# Patient Record
Sex: Male | Born: 1950
Health system: Southern US, Community
[De-identification: ages and names within clinical notes are randomized; demographics above are authoritative.]

## PROBLEM LIST (undated history)

## (undated) HISTORY — PX: KNEE SURGERY: SHX244

---

## 2011-01-10 ENCOUNTER — Other Ambulatory Visit (HOSPITAL_COMMUNITY): Payer: Self-pay | Admitting: Orthopedic Surgery

## 2011-01-10 ENCOUNTER — Ambulatory Visit (HOSPITAL_COMMUNITY)
Admission: RE | Admit: 2011-01-10 | Discharge: 2011-01-10 | Disposition: A | Discharge status: Home / Self Care | Payer: 59 | Source: Ambulatory Visit | Attending: Orthopedic Surgery | Admitting: Orthopedic Surgery

## 2011-01-10 ENCOUNTER — Encounter (HOSPITAL_COMMUNITY)
Admission: RE | Admit: 2011-01-10 | Discharge: 2011-01-10 | Disposition: A | Discharge status: Home / Self Care | Payer: 59 | Source: Ambulatory Visit | Attending: Orthopedic Surgery | Admitting: Orthopedic Surgery

## 2011-01-10 DIAGNOSIS — M1711 Unilateral primary osteoarthritis, right knee: Secondary | ICD-10-CM

## 2011-01-10 DIAGNOSIS — Z01812 Encounter for preprocedural laboratory examination: Secondary | ICD-10-CM | POA: Insufficient documentation

## 2011-01-10 DIAGNOSIS — Z01818 Encounter for other preprocedural examination: Secondary | ICD-10-CM | POA: Insufficient documentation

## 2011-01-10 DIAGNOSIS — IMO0002 Reserved for concepts with insufficient information to code with codable children: Secondary | ICD-10-CM | POA: Insufficient documentation

## 2011-01-10 DIAGNOSIS — M1710 Unilateral primary osteoarthritis, unspecified knee: Secondary | ICD-10-CM | POA: Insufficient documentation

## 2011-01-10 LAB — COMPREHENSIVE METABOLIC PANEL
ALT: 17 U/L (ref 0–53)
AST: 17 U/L (ref 0–37)
CO2: 30 mEq/L (ref 19–32)
Calcium: 9.2 mg/dL (ref 8.4–10.5)
Chloride: 104 mEq/L (ref 96–112)
GFR calc non Af Amer: >60 mL/min (ref >60)
Sodium: 141 mEq/L (ref 135–145)

## 2011-01-10 LAB — SURGICAL PCR SCREEN: MRSA, PCR: NEGATIVE

## 2011-01-10 LAB — URINALYSIS, ROUTINE W REFLEX MICROSCOPIC
Nitrite: NEGATIVE
Specific Gravity, Urine: 1.01 (ref 1.005–1.030)
Urobilinogen, UA: 0.2 mg/dL (ref 0.0–1.0)

## 2011-01-10 LAB — CBC
MCH: 30.7 pg (ref 26.0–34.0)
Platelets: 209 10*3/uL (ref 150–400)
RBC: 5.25 MIL/uL (ref 4.22–5.81)
WBC: 7.7 10*3/uL (ref 4.0–10.5)

## 2011-01-10 LAB — APTT: aPTT: 30 seconds (ref 24–37)

## 2011-01-11 LAB — URINE CULTURE

## 2011-01-16 ENCOUNTER — Inpatient Hospital Stay (HOSPITAL_COMMUNITY)
Admission: RE | Admit: 2011-01-16 | Discharge: 2011-01-18 | DRG: 470 | Disposition: A | Discharge status: Home Health | Payer: 59 | Source: Ambulatory Visit | Attending: Orthopedic Surgery | Admitting: Orthopedic Surgery

## 2011-01-16 ENCOUNTER — Inpatient Hospital Stay (HOSPITAL_COMMUNITY): Payer: 59

## 2011-01-16 DIAGNOSIS — Z91040 Latex allergy status: Secondary | ICD-10-CM

## 2011-01-16 DIAGNOSIS — N400 Enlarged prostate without lower urinary tract symptoms: Secondary | ICD-10-CM | POA: Diagnosis present

## 2011-01-16 DIAGNOSIS — G8929 Other chronic pain: Secondary | ICD-10-CM | POA: Diagnosis present

## 2011-01-16 DIAGNOSIS — M24569 Contracture, unspecified knee: Secondary | ICD-10-CM | POA: Diagnosis present

## 2011-01-16 DIAGNOSIS — M1710 Unilateral primary osteoarthritis, unspecified knee: Principal | ICD-10-CM | POA: Diagnosis present

## 2011-01-16 LAB — ABO/RH: ABO/RH(D): O POS

## 2011-01-16 LAB — TYPE AND SCREEN: ABO/RH(D): O POS

## 2011-01-17 LAB — BASIC METABOLIC PANEL
Calcium: 7.9 mg/dL — ABNORMAL LOW (ref 8.4–10.5)
Creatinine, Ser: 1.01 mg/dL (ref 0.50–1.35)
GFR calc Af Amer: >60 mL/min (ref >60)

## 2011-01-17 LAB — PROTIME-INR
INR: 1.08 (ref 0.00–1.49)
Prothrombin Time: 14.2 seconds (ref 11.6–15.2)

## 2011-01-17 LAB — CBC
MCHC: 34.5 g/dL (ref 30.0–36.0)
Platelets: 168 10*3/uL (ref 150–400)
RDW: 12.5 % (ref 11.5–15.5)

## 2011-01-18 LAB — PROTIME-INR: INR: 1.33 (ref 0.00–1.49)

## 2011-01-18 LAB — BASIC METABOLIC PANEL
Calcium: 8.4 mg/dL (ref 8.4–10.5)
GFR calc non Af Amer: >60 mL/min (ref >60)
Glucose, Bld: 118 mg/dL — ABNORMAL HIGH (ref 70–99)
Sodium: 137 mEq/L (ref 135–145)

## 2011-01-18 LAB — CBC
MCV: 85.6 fL (ref 78.0–100.0)
Platelets: 157 10*3/uL (ref 150–400)
RDW: 12.4 % (ref 11.5–15.5)
WBC: 8.8 10*3/uL (ref 4.0–10.5)

## 2011-01-24 NOTE — Op Note (Signed)
Eddie [name], CONLEY NO.:  [id number]  MEDICAL RECORD NO.:  [id number]  LOCATION:  5017                         FACILITY:  [facility]  PHYSICIAN:  Loreta Ave, M.D. DATE OF BIRTH:  07/07/1951  DATE OF PROCEDURE:  01/16/2011 DATE OF DISCHARGE:                              OPERATIVE REPORT   PREOPERATIVE DIAGNOSES:  Right knee end-stage degenerative arthritis, varus alignment, and mild flexion contracture.  POSTOPERATIVE DIAGNOSES:  Right knee end-stage degenerative arthritis, varus alignment, and mild flexion contracture.  PROCEDURES:  Right knee modified minimally invasive total knee replacement.  Soft tissue balancing.  Stryker triathlon prosthesis. Cemented pegged posterior stabilized #7 femoral component.  Cemented #7 tibial component, 9-mm polyethylene insert.  Resurfacing pegged medial offset cemented 38-mm patellar component.  SURGEON:  Loreta Ave, MD  ASSISTANT:  Genene Churn. Barry Dienes, [place], present throughout the entire case and necessary for timely completion of procedure.  ANESTHESIA:  General.  BLOOD LOSS:  Minimal.  SPECIMEN:  None.  CULTURE:  None.  COMPLICATIONS:  None.  DRESSING:  Soft compressive knee immobilizer.  DRAIN:  Hemovac x1.  TOURNIQUET TIME:  One hour.  PROCEDURE:  The patient was brought to the operating room and placed on the operating table in supine position.  After adequate anesthesia had been obtained, right knee was examined.  Varus alignment partially correctable, very mild flexion contracture.  Good flexion.  Tourniquet was applied, prepped and draped in the usual sterile fashion. Exsanguinated with elevation of Esmarch, tourniquet was inflated to 350 mmHg.  Straight incision above the patella down to tibial tubercle. Hemostasis with cautery.  Medial arthrotomy, vastus splitting preserving quad tendon.  Knee was exposed, grade 4 change throughout.  Remnants of menisci, cruciate ligaments, loose bodies, and  spurs were removed. Distal femur was exposed.  Intramedullary guide was placed.  Distal cut 10 mm set at 5 degrees of valgus.  Using epicondylar axis, sized, cut, and fitted for posterior stabilized #7 femoral component.  Proximal tibia was exposed.  Extramedullary guide.  3-degree posterior slope cut below the defect medially for a 9-mm insert.  Size #7 component.  Good balancing flexion/extension.  All recess was examined to be sure all debris was cleared throughout.  Wound was irrigated.  Patella was exposed, posterior 10 mm removed.  Size, drilled, and fitted for a 38-mm component.  Trial was put in place.  With a 9-mm insert, full extension, full flexion, good alignment, good stability, good tracking, good mechanical axis.  Tibia was marked for rotation and hand reamed.  All trials were removed.  Copious irrigation with pulse irrigating device. Cement prepared and placed on all components, firmly seated. Polyethylene attached to tibia, knee was reduced.  Patella was held with a clamp.  Once cement hardened, the knee was reexamined and again I was pleased with alignment, stability, and motion.  Hemovac was placed through a separate stab wound.  Arthrotomy was closed with #1 Vicryl, skin and subcutaneous tissue with Vicryl and staples.  Sterile compressive dressing was applied.  Tourniquet was deflated and removed. Knee immobilizer was applied.  Anesthesia was reversed.  Brought to the recovery room.  Tolerated the surgery well.  No complications.  Loreta Ave, M.D.     DFM/MEDQ  D:  01/17/2011  T:  01/18/2011  Job:  [id number]  Electronically Signed by Mckinley Jewel M.D. on 01/24/2011 02:20:06 PM

## 2017-12-19 DIAGNOSIS — Z0000 Encounter for general adult medical examination without abnormal findings: Secondary | ICD-10-CM | POA: Diagnosis not present

## 2017-12-30 DIAGNOSIS — R0609 Other forms of dyspnea: Secondary | ICD-10-CM

## 2018-01-01 DIAGNOSIS — Z6833 Body mass index (BMI) 33.0-33.9, adult: Secondary | ICD-10-CM | POA: Diagnosis not present

## 2018-01-01 DIAGNOSIS — I10 Essential (primary) hypertension: Secondary | ICD-10-CM | POA: Diagnosis not present

## 2018-03-20 DIAGNOSIS — N411 Chronic prostatitis: Secondary | ICD-10-CM | POA: Diagnosis not present

## 2018-03-20 DIAGNOSIS — E782 Mixed hyperlipidemia: Secondary | ICD-10-CM | POA: Diagnosis not present

## 2018-03-20 DIAGNOSIS — Z79899 Other long term (current) drug therapy: Secondary | ICD-10-CM | POA: Diagnosis not present

## 2018-09-08 DIAGNOSIS — Z6833 Body mass index (BMI) 33.0-33.9, adult: Secondary | ICD-10-CM | POA: Diagnosis not present

## 2018-09-08 DIAGNOSIS — E669 Obesity, unspecified: Secondary | ICD-10-CM | POA: Diagnosis not present

## 2018-09-08 DIAGNOSIS — R0781 Pleurodynia: Secondary | ICD-10-CM | POA: Diagnosis not present

## 2018-11-03 DIAGNOSIS — R350 Frequency of micturition: Secondary | ICD-10-CM | POA: Diagnosis not present

## 2018-11-03 DIAGNOSIS — E669 Obesity, unspecified: Secondary | ICD-10-CM | POA: Diagnosis not present

## 2018-11-03 DIAGNOSIS — H6593 Unspecified nonsuppurative otitis media, bilateral: Secondary | ICD-10-CM | POA: Diagnosis not present

## 2018-11-03 DIAGNOSIS — N390 Urinary tract infection, site not specified: Secondary | ICD-10-CM | POA: Diagnosis not present

## 2018-11-03 DIAGNOSIS — I10 Essential (primary) hypertension: Secondary | ICD-10-CM | POA: Diagnosis not present

## 2018-11-03 DIAGNOSIS — Z6832 Body mass index (BMI) 32.0-32.9, adult: Secondary | ICD-10-CM | POA: Diagnosis not present

## 2018-11-03 DIAGNOSIS — R42 Dizziness and giddiness: Secondary | ICD-10-CM | POA: Diagnosis not present

## 2018-12-17 DIAGNOSIS — H8302 Labyrinthitis, left ear: Secondary | ICD-10-CM | POA: Diagnosis not present

## 2018-12-17 DIAGNOSIS — H938X2 Other specified disorders of left ear: Secondary | ICD-10-CM | POA: Diagnosis not present

## 2018-12-17 DIAGNOSIS — H7403 Tympanosclerosis, bilateral: Secondary | ICD-10-CM | POA: Diagnosis not present

## 2018-12-17 DIAGNOSIS — H9312 Tinnitus, left ear: Secondary | ICD-10-CM | POA: Diagnosis not present

## 2018-12-17 DIAGNOSIS — H9192 Unspecified hearing loss, left ear: Secondary | ICD-10-CM | POA: Diagnosis not present

## 2018-12-17 DIAGNOSIS — Z8679 Personal history of other diseases of the circulatory system: Secondary | ICD-10-CM | POA: Diagnosis not present

## 2019-01-04 DIAGNOSIS — R739 Hyperglycemia, unspecified: Secondary | ICD-10-CM | POA: Diagnosis not present

## 2019-01-04 DIAGNOSIS — H8393 Unspecified disease of inner ear, bilateral: Secondary | ICD-10-CM | POA: Diagnosis not present

## 2019-01-04 DIAGNOSIS — Z6832 Body mass index (BMI) 32.0-32.9, adult: Secondary | ICD-10-CM | POA: Diagnosis not present

## 2019-01-04 DIAGNOSIS — N411 Chronic prostatitis: Secondary | ICD-10-CM | POA: Diagnosis not present

## 2019-01-20 DIAGNOSIS — Z9181 History of falling: Secondary | ICD-10-CM | POA: Diagnosis not present

## 2019-01-20 DIAGNOSIS — H8393 Unspecified disease of inner ear, bilateral: Secondary | ICD-10-CM | POA: Diagnosis not present

## 2019-01-20 DIAGNOSIS — Z6832 Body mass index (BMI) 32.0-32.9, adult: Secondary | ICD-10-CM | POA: Diagnosis not present

## 2019-01-20 DIAGNOSIS — E782 Mixed hyperlipidemia: Secondary | ICD-10-CM | POA: Diagnosis not present

## 2019-01-20 DIAGNOSIS — I10 Essential (primary) hypertension: Secondary | ICD-10-CM | POA: Diagnosis not present

## 2019-01-20 DIAGNOSIS — N401 Enlarged prostate with lower urinary tract symptoms: Secondary | ICD-10-CM | POA: Diagnosis not present

## 2019-01-20 DIAGNOSIS — Z0000 Encounter for general adult medical examination without abnormal findings: Secondary | ICD-10-CM | POA: Diagnosis not present

## 2019-01-20 DIAGNOSIS — E669 Obesity, unspecified: Secondary | ICD-10-CM | POA: Diagnosis not present

## 2019-01-20 DIAGNOSIS — N138 Other obstructive and reflux uropathy: Secondary | ICD-10-CM | POA: Diagnosis not present

## 2019-01-20 DIAGNOSIS — Z1331 Encounter for screening for depression: Secondary | ICD-10-CM | POA: Diagnosis not present

## 2019-02-18 DIAGNOSIS — H9319 Tinnitus, unspecified ear: Secondary | ICD-10-CM | POA: Diagnosis not present

## 2019-02-18 DIAGNOSIS — H903 Sensorineural hearing loss, bilateral: Secondary | ICD-10-CM | POA: Diagnosis not present

## 2019-02-18 DIAGNOSIS — H8110 Benign paroxysmal vertigo, unspecified ear: Secondary | ICD-10-CM | POA: Diagnosis not present

## 2019-02-18 DIAGNOSIS — H7403 Tympanosclerosis, bilateral: Secondary | ICD-10-CM | POA: Diagnosis not present

## 2019-02-18 DIAGNOSIS — R42 Dizziness and giddiness: Secondary | ICD-10-CM | POA: Diagnosis not present

## 2019-04-26 DIAGNOSIS — N400 Enlarged prostate without lower urinary tract symptoms: Secondary | ICD-10-CM | POA: Diagnosis not present

## 2019-04-26 DIAGNOSIS — N411 Chronic prostatitis: Secondary | ICD-10-CM | POA: Diagnosis not present

## 2019-04-26 DIAGNOSIS — N138 Other obstructive and reflux uropathy: Secondary | ICD-10-CM | POA: Diagnosis not present

## 2019-04-26 DIAGNOSIS — N401 Enlarged prostate with lower urinary tract symptoms: Secondary | ICD-10-CM | POA: Diagnosis not present

## 2019-05-13 DIAGNOSIS — M722 Plantar fascial fibromatosis: Secondary | ICD-10-CM | POA: Diagnosis not present

## 2019-05-13 DIAGNOSIS — M79672 Pain in left foot: Secondary | ICD-10-CM | POA: Diagnosis not present

## 2019-05-13 DIAGNOSIS — M7732 Calcaneal spur, left foot: Secondary | ICD-10-CM | POA: Diagnosis not present

## 2019-05-27 DIAGNOSIS — N411 Chronic prostatitis: Secondary | ICD-10-CM | POA: Diagnosis not present

## 2019-05-27 DIAGNOSIS — N401 Enlarged prostate with lower urinary tract symptoms: Secondary | ICD-10-CM | POA: Diagnosis not present

## 2019-05-27 DIAGNOSIS — N138 Other obstructive and reflux uropathy: Secondary | ICD-10-CM | POA: Diagnosis not present

## 2019-06-29 DIAGNOSIS — N529 Male erectile dysfunction, unspecified: Secondary | ICD-10-CM | POA: Insufficient documentation

## 2019-06-29 DIAGNOSIS — N4281 Prostatodynia syndrome: Secondary | ICD-10-CM | POA: Diagnosis not present

## 2019-06-29 HISTORY — DX: Male erectile dysfunction, unspecified: N52.9

## 2019-09-17 DIAGNOSIS — I499 Cardiac arrhythmia, unspecified: Secondary | ICD-10-CM | POA: Diagnosis not present

## 2019-09-17 DIAGNOSIS — Z6834 Body mass index (BMI) 34.0-34.9, adult: Secondary | ICD-10-CM | POA: Diagnosis not present

## 2019-09-23 DIAGNOSIS — L820 Inflamed seborrheic keratosis: Secondary | ICD-10-CM | POA: Diagnosis not present

## 2019-09-23 DIAGNOSIS — D225 Melanocytic nevi of trunk: Secondary | ICD-10-CM | POA: Diagnosis not present

## 2019-09-23 DIAGNOSIS — L710 Perioral dermatitis: Secondary | ICD-10-CM | POA: Diagnosis not present

## 2019-09-23 DIAGNOSIS — L821 Other seborrheic keratosis: Secondary | ICD-10-CM | POA: Diagnosis not present

## 2019-09-24 ENCOUNTER — Other Ambulatory Visit: Payer: Self-pay

## 2019-09-24 ENCOUNTER — Encounter: Payer: Self-pay | Admitting: Cardiology

## 2019-09-24 ENCOUNTER — Ambulatory Visit: Payer: PPO | Admitting: Cardiology

## 2019-09-24 DIAGNOSIS — I10 Essential (primary) hypertension: Secondary | ICD-10-CM | POA: Diagnosis not present

## 2019-09-24 DIAGNOSIS — R011 Cardiac murmur, unspecified: Secondary | ICD-10-CM | POA: Diagnosis not present

## 2019-09-24 DIAGNOSIS — R9431 Abnormal electrocardiogram [ECG] [EKG]: Secondary | ICD-10-CM | POA: Insufficient documentation

## 2019-09-24 HISTORY — DX: Cardiac murmur, unspecified: R01.1

## 2019-09-24 HISTORY — DX: Essential (primary) hypertension: I10

## 2019-09-24 HISTORY — DX: Abnormal electrocardiogram (ECG) (EKG): R94.31

## 2019-09-24 NOTE — Progress Notes (Signed)
Cardiology Office Note:    Date:  09/24/2019   ID:  Eddie [name], DOB 06-24-1951, MRN [id number]  PCP:  Street, Sharon Mt, MD  Cardiologist:  Jenean Lindau, MD   Referring MD: Ronita Hipps, MD    ASSESSMENT:    1. Abnormal EKG   2. Cardiac murmur   3. Essential hypertension    PLAN:    In order of problems listed above:  1. Essential hypertension: I think the patient's blood pressure is low.  He tells me that it runs about 123456 systolic at home.  I told him to cut his tablet to have she will take lisinopril 5 mg daily.  He will keep a track of his blood pressures twice a day and bring it to me. 2. Importance of regular exercise stressed and weight reduction was stressed and diet was emphasized. 3. Cardiac murmur: Echocardiogram will be done to assess murmur heard on auscultation. 4. Patient will be seen in follow-up appointment in a month or earlier if he has any concerns.   Medication Adjustments/Labs and Tests Ordered: Current medicines are reviewed at length with the patient today.  Concerns regarding medicines are outlined above.  No orders of the defined types were placed in this encounter.  No orders of the defined types were placed in this encounter.    History of Present Illness:    Eddie [name] is a 69 y.o. male who is being seen today for the evaluation of abnormal EKG at the request of Ronita Hipps, MD.  Patient is a pleasant 69 year old male.  He has past medical history of essential hypertension.  He mentions to me that his doctor found his EKG to be irregular and sent here for evaluation.  He denies any chest pain orthopnea or PND.  He is an active gentleman and exercises some on a regular basis.  He is overweight.  At the time of my evaluation, the patient is alert awake oriented and in no distress.  He also mentions to me that when he changes posture he has dizziness.  This is suggestive of postural hypotension.  At the time of my evaluation, the  patient is alert awake oriented and in no distress.  History reviewed. No pertinent past medical history.  Past Surgical History:  Procedure Laterality Date   KNEE SURGERY Right     Current Medications: Current Meds  Medication Sig   aspirin 81 MG EC tablet Take by mouth.   lisinopril (ZESTRIL) 10 MG tablet TAKE 1 TABLET BY MOUTH EVERY DAY   MELATONIN PO Take by mouth.   tamsulosin (FLOMAX) 0.4 MG CAPS capsule TAKE 1 CAPSULE BY MOUTH EVERY DAY     Allergies:   Latex   Social History   Socioeconomic History   Marital status: Married    Spouse name: Not on file   Number of children: Not on file   Years of education: Not on file   Highest education level: Not on file  Occupational History   Not on file  Tobacco Use   Smoking status: Former Smoker    Types: Cigarettes    Quit date: 09/24/1987    Years since quitting: 32.0   Smokeless tobacco: Former Systems developer    Quit date: 09/24/1987  Substance and Sexual Activity   Alcohol use: Never   Drug use: Never   Sexual activity: Not on file  Other Topics Concern   Not on file  Social History Narrative   Not on file  Social Determinants of Health   Financial Resource Strain:    Difficulty of Paying Living Expenses: Not on file  Food Insecurity:    Worried About Charity fundraiser in the Last Year: Not on file   [employer] of Food in the Last Year: Not on file  Transportation Needs:    Lack of Transportation (Medical): Not on file   Lack of Transportation (Non-Medical): Not on file  Physical Activity:    Days of Exercise per Week: Not on file   Minutes of Exercise per Session: Not on file  Stress:    Feeling of Stress : Not on file  Social Connections:    Frequency of Communication with Friends and Family: Not on file   Frequency of Social Gatherings with Friends and Family: Not on file   Attends Religious Services: Not on file   Active Member of Clubs or Organizations: Not on file   Attends  Archivist Meetings: Not on file   Marital Status: Not on file     Family History: The patient's family history is not on file.  ROS:   Please see the history of present illness.    All other systems reviewed and are negative.  EKGs/Labs/Other Studies Reviewed:    The following studies were reviewed today: EKG reveals sinus rhythm and nonspecific ST-T changes.   Recent Labs: No results found for requested labs within last 8760 hours.  Recent Lipid Panel No results found for: CHOL, TRIG, HDL, CHOLHDL, VLDL, LDLCALC, LDLDIRECT  Physical Exam:    VS:  BP (!) 142/82    Pulse 73    Ht 5\' 10"  (1.778 m)    Wt 232 lb (105.2 kg)    SpO2 97%    BMI 33.29 kg/m     Wt Readings from Last 3 Encounters:  09/24/19 232 lb (105.2 kg)     GEN: Patient is in no acute distress HEENT: Normal NECK: No JVD; No carotid bruits LYMPHATICS: No lymphadenopathy CARDIAC: S1 S2 regular, 2/6 systolic murmur at the apex. RESPIRATORY:  Clear to auscultation without rales, wheezing or rhonchi  ABDOMEN: Soft, non-tender, non-distended MUSCULOSKELETAL:  No edema; No deformity  SKIN: Warm and dry NEUROLOGIC:  Alert and oriented x 3 PSYCHIATRIC:  Normal affect    Signed, Jenean Lindau, MD  09/24/2019 2:17 PM    [facility]

## 2019-09-24 NOTE — Patient Instructions (Signed)
Medication Instructions:  Your physician has recommended you make the following change in your medication:   Start Lisinopril 10 mg take 0.5 (1/2) tablet daily. *If you need a refill on your cardiac medications before your next appointment, please call your pharmacy*   Lab Work: None ordered If you have labs (blood work) drawn today and your tests are completely normal, you will receive your results only by: Marland Kitchen MyChart Message (if you have MyChart) OR . A paper copy in the mail If you have any lab test that is abnormal or we need to change your treatment, we will call you to review the results.   Testing/Procedures: Your physician has requested that you have an echocardiogram. Echocardiography is a painless test that uses sound waves to create images of your heart. It provides your doctor with information about the size and shape of your heart and how well your heart's chambers and valves are working. This procedure takes approximately one hour. There are no restrictions for this procedure.    Follow-Up: At [facility], you and your health needs are our priority.  As part of our continuing mission to provide you with exceptional heart care, we have created designated Provider Care Teams.  These Care Teams include your primary Cardiologist (physician) and Advanced Practice Providers (APPs -  Physician Assistants and Nurse Practitioners) who all work together to provide you with the care you need, when you need it.  We recommend signing up for the patient portal called "MyChart".  Sign up information is provided on this After Visit Summary.  MyChart is used to connect with patients for Virtual Visits (Telemedicine).  Patients are able to view lab/test results, encounter notes, upcoming appointments, etc.  Non-urgent messages can be sent to your provider as well.   To learn more about what you can do with MyChart, go to [email or web address].    Your next appointment:   1 month(s)  The  format for your next appointment:   In Person  Provider:   Jyl Heinz, MD   Other Instructions  Echocardiogram An echocardiogram is a procedure that uses painless sound waves (ultrasound) to produce an image of the heart. Images from an echocardiogram can provide important information about:  Signs of coronary artery disease (CAD).  Aneurysm detection. An aneurysm is a weak or damaged part of an artery wall that bulges out from the normal force of blood pumping through the body.  Heart size and shape. Changes in the size or shape of the heart can be associated with certain conditions, including heart failure, aneurysm, and CAD.  Heart muscle function.  Heart valve function.  Signs of a past heart attack.  Fluid buildup around the heart.  Thickening of the heart muscle.  A tumor or infectious growth around the heart valves. Tell a health care provider about:  Any allergies you have.  All medicines you are taking, including vitamins, herbs, eye drops, creams, and over-the-counter medicines.  Any blood disorders you have.  Any surgeries you have had.  Any medical conditions you have.  Whether you are pregnant or may be pregnant. What are the risks? Generally, this is a safe procedure. However, problems may occur, including:  Allergic reaction to dye (contrast) that may be used during the procedure. What happens before the procedure? No specific preparation is needed. You may eat and drink normally. What happens during the procedure?   An IV tube may be inserted into one of your veins.  You may receive contrast through  this tube. A contrast is an injection that improves the quality of the pictures from your heart.  A gel will be applied to your chest.  A wand-like tool (transducer) will be moved over your chest. The gel will help to transmit the sound waves from the transducer.  The sound waves will harmlessly bounce off of your heart to allow the heart  images to be captured in real-time motion. The images will be recorded on a computer. The procedure may vary among health care providers and hospitals. What happens after the procedure?  You may return to your normal, everyday life, including diet, activities, and medicines, unless your health care provider tells you not to do that. Summary  An echocardiogram is a procedure that uses painless sound waves (ultrasound) to produce an image of the heart.  Images from an echocardiogram can provide important information about the size and shape of your heart, heart muscle function, heart valve function, and fluid buildup around your heart.  You do not need to do anything to prepare before this procedure. You may eat and drink normally.  After the echocardiogram is completed, you may return to your normal, everyday life, unless your health care provider tells you not to do that. This information is not intended to replace advice given to you by your health care provider. Make sure you discuss any questions you have with your health care provider. Document Revised: 11/05/2018 Document Reviewed: 08/17/2016 Elsevier Patient Education  [place].

## 2019-10-12 ENCOUNTER — Telehealth: Payer: Self-pay

## 2019-10-12 ENCOUNTER — Other Ambulatory Visit: Payer: Self-pay

## 2019-10-12 DIAGNOSIS — R42 Dizziness and giddiness: Secondary | ICD-10-CM

## 2019-10-12 NOTE — Telephone Encounter (Signed)
I have spoke with a patient and called [facility] for recent labs. I will call the pt back regarding his monitor.

## 2019-10-12 NOTE — Telephone Encounter (Signed)
Called and spoke with the pt. Pt states that he had labs done at [facility] recently. I haved called the [facility] office to get labs faxed.

## 2019-10-12 NOTE — Progress Notes (Unsigned)
Monitor

## 2019-10-27 ENCOUNTER — Ambulatory Visit (INDEPENDENT_AMBULATORY_CARE_PROVIDER_SITE_OTHER): Payer: PPO

## 2019-10-27 DIAGNOSIS — R42 Dizziness and giddiness: Secondary | ICD-10-CM | POA: Diagnosis not present

## 2019-11-05 ENCOUNTER — Ambulatory Visit (INDEPENDENT_AMBULATORY_CARE_PROVIDER_SITE_OTHER): Payer: PPO

## 2019-11-05 ENCOUNTER — Other Ambulatory Visit: Payer: Self-pay

## 2019-11-05 DIAGNOSIS — R9431 Abnormal electrocardiogram [ECG] [EKG]: Secondary | ICD-10-CM

## 2019-11-05 DIAGNOSIS — I10 Essential (primary) hypertension: Secondary | ICD-10-CM

## 2019-11-05 DIAGNOSIS — R011 Cardiac murmur, unspecified: Secondary | ICD-10-CM

## 2019-11-05 NOTE — Progress Notes (Unsigned)
Complete echocardiogram performed.  Jimmy Thurmon Mizell RDCS, RVT  

## 2019-11-08 ENCOUNTER — Ambulatory Visit: Payer: PPO | Admitting: Cardiology

## 2019-11-15 ENCOUNTER — Ambulatory Visit: Payer: PPO | Admitting: Cardiology

## 2019-11-15 ENCOUNTER — Other Ambulatory Visit: Payer: Self-pay

## 2019-11-15 ENCOUNTER — Encounter: Payer: Self-pay | Admitting: Cardiology

## 2019-11-15 VITALS — BP 146/88 | HR 66 | Temp 97.2°F | Ht 70.0 in | Wt 225.6 lb

## 2019-11-15 DIAGNOSIS — I480 Paroxysmal atrial fibrillation: Secondary | ICD-10-CM | POA: Diagnosis not present

## 2019-11-15 DIAGNOSIS — I10 Essential (primary) hypertension: Secondary | ICD-10-CM

## 2019-11-15 HISTORY — DX: Paroxysmal atrial fibrillation: I48.0

## 2019-11-15 NOTE — Progress Notes (Signed)
Cardiology Office Note:    Date:  11/15/2019   ID:  Eddie [name], DOB [birth year], MRN [id number]  PCP:  Street, Sharon Mt, MD  Cardiologist:  Jenean Lindau, MD   Referring MD: [id number] Sharon Mt, *    ASSESSMENT:    1. Essential hypertension   2. Paroxysmal atrial fibrillation (HCC)    PLAN:    In order of problems listed above:  1. Primary prevention stressed with the patient.  Importance of compliance with diet medication stressed and he vocalized understanding.  I will try to get blood work report from his primary care physician to review his TSH and lipids. 2. Essential hypertension: Blood pressure is elevated and have asked him to double up on his lisinopril to 10 mg daily.  He will keep a track of his blood pressures and send them to me. 3. Paroxysmal atrial fibrillation: Rhythm strips or EKG from primary care physician's office revealed this.  He is wearing a event monitor and he is going to wear it for another week I will review it.  He is not on anticoagulation because his CHADS2 score is just 1. 4. Patient will be seen in follow-up appointment in 3 months or earlier if the patient has any concerns.  Patient had multiple questions which were answered to his satisfaction.   Medication Adjustments/Labs and Tests Ordered: Current medicines are reviewed at length with the patient today.  Concerns regarding medicines are outlined above.  No orders of the defined types were placed in this encounter.  No orders of the defined types were placed in this encounter.    No chief complaint on file.    History of Present Illness:    Eddie [name] is a 69 y.o. male.  Patient denies any problems at this time and takes care of activities of daily living.  No chest pain orthopnea or PND.  Mentions to me that his family member has sent a message and my chart for [place] and this was reviewed by me and reveals atrial fibrillation.  Patient denies any history of diabetes  mellitus congestive heart failure or any atherosclerotic vascular disease.  Again he is asymptomatic's and takes care of activities of daily living.  At the time of my evaluation, the patient is alert awake oriented and in no distress.  History reviewed. No pertinent past medical history.  Past Surgical History:  Procedure Laterality Date  . KNEE SURGERY Right     Current Medications: Current Meds  Medication Sig  . aspirin 81 MG EC tablet Take by mouth.  . diphenhydramine-acetaminophen (TYLENOL PM) 25-500 MG TABS tablet Take 1 tablet by mouth at bedtime as needed.  Marland Kitchen lisinopril (ZESTRIL) 5 MG tablet Take 5 mg by mouth daily.  . MELOXICAM PO Take by mouth.  . tamsulosin (FLOMAX) 0.4 MG CAPS capsule TAKE 1 CAPSULE BY MOUTH EVERY DAY     Allergies:   Latex   Social History   Socioeconomic History  . Marital status: Married    Spouse name: Not on file  . Number of children: Not on file  . Years of education: Not on file  . Highest education level: Not on file  Occupational History  . Not on file  Tobacco Use  . Smoking status: Former Smoker    Types: Cigarettes    Quit date: 09/24/1987    Years since quitting: 32.1  . Smokeless tobacco: Former Systems developer    Quit date: 09/24/1987  Substance and Sexual Activity  .  Alcohol use: Never  . Drug use: Never  . Sexual activity: Not on file  Other Topics Concern  . Not on file  Social History Narrative  . Not on file   Social Determinants of Health   Financial Resource Strain:   . Difficulty of Paying Living Expenses:   Food Insecurity:   . Worried About Charity fundraiser in the Last Year:   . Arboriculturist in the Last Year:   Transportation Needs:   . Film/video editor (Medical):   Marland Kitchen Lack of Transportation (Non-Medical):   Physical Activity:   . Days of Exercise per Week:   . Minutes of Exercise per Session:   Stress:   . Feeling of Stress :   Social Connections:   . Frequency of Communication with Friends and  Family:   . Frequency of Social Gatherings with Friends and Family:   . Attends Religious Services:   . Active Member of Clubs or Organizations:   . Attends Archivist Meetings:   Marland Kitchen Marital Status:      Family History: The patient's family history is not on file.  ROS:   Please see the history of present illness.    All other systems reviewed and are negative.  EKGs/Labs/Other Studies Reviewed:    The following studies were reviewed today: IMPRESSIONS    1. Left ventricular ejection fraction, by estimation, is 60 to 65%. The  left ventricle has normal function. The left ventricle has no regional  wall motion abnormalities. Left ventricular diastolic parameters were  normal.  2. Right ventricular systolic function is normal. The right ventricular  size is normal. There is normal pulmonary artery systolic pressure.  3. Left atrial size was mild to moderately dilated.  4. The mitral valve is normal in structure. No evidence of mitral valve  regurgitation. No evidence of mitral stenosis.  5. The aortic valve is normal in structure. Aortic valve regurgitation is  not visualized. No aortic stenosis is present.  6. There is mild dilatation at the level of the sinuses of Valsalva  measuring 42 mm.  7. The inferior vena cava is normal in size with greater than 50%  respiratory variability, suggesting right atrial pressure of 3 mmHg.    Recent Labs: No results found for requested labs within last 8760 hours.  Recent Lipid Panel No results found for: CHOL, TRIG, HDL, CHOLHDL, VLDL, LDLCALC, LDLDIRECT  Physical Exam:    VS:  BP (!) 146/88   Pulse 66   Temp (!) 97.2 F (36.2 C)   Ht 5\' 10"  (1.778 m)   Wt 225 lb 9.6 oz (102.3 kg)   SpO2 95%   BMI 32.37 kg/m     Wt Readings from Last 3 Encounters:  11/15/19 225 lb 9.6 oz (102.3 kg)  09/24/19 232 lb (105.2 kg)     GEN: Patient is in no acute distress HEENT: Normal NECK: No JVD; No carotid  bruits LYMPHATICS: No lymphadenopathy CARDIAC: Hear sounds regular, 2/6 systolic murmur at the apex. RESPIRATORY:  Clear to auscultation without rales, wheezing or rhonchi  ABDOMEN: Soft, non-tender, non-distended MUSCULOSKELETAL:  No edema; No deformity  SKIN: Warm and dry NEUROLOGIC:  Alert and oriented x 3 PSYCHIATRIC:  Normal affect   Signed, Jenean Lindau, MD  11/15/2019 8:34 AM    [place]

## 2019-11-15 NOTE — Patient Instructions (Signed)

## 2019-12-14 ENCOUNTER — Encounter: Payer: Self-pay | Admitting: Cardiology

## 2019-12-14 ENCOUNTER — Ambulatory Visit: Payer: PPO | Admitting: Cardiology

## 2019-12-14 ENCOUNTER — Other Ambulatory Visit: Payer: Self-pay

## 2019-12-14 VITALS — BP 130/76 | HR 72 | Ht 70.0 in | Wt 220.0 lb

## 2019-12-14 DIAGNOSIS — I10 Essential (primary) hypertension: Secondary | ICD-10-CM

## 2019-12-14 DIAGNOSIS — M5432 Sciatica, left side: Secondary | ICD-10-CM | POA: Diagnosis not present

## 2019-12-14 DIAGNOSIS — Z6834 Body mass index (BMI) 34.0-34.9, adult: Secondary | ICD-10-CM | POA: Diagnosis not present

## 2019-12-14 LAB — BASIC METABOLIC PANEL
BUN/Creatinine Ratio: 14 (ref 10–24)
BUN: 14 mg/dL (ref 8–27)
CO2: 22 mmol/L (ref 20–29)
Calcium: 9.1 mg/dL (ref 8.6–10.2)
Chloride: 105 mmol/L (ref 96–106)
Creatinine, Ser: 0.97 mg/dL (ref 0.76–1.27)
GFR calc Af Amer: 93 mL/min/{1.73_m2} (ref >59)
GFR calc non Af Amer: 80 mL/min/{1.73_m2} (ref >59)
Glucose: 125 mg/dL — ABNORMAL HIGH (ref 65–99)
Potassium: 4.3 mmol/L (ref 3.5–5.2)
Sodium: 140 mmol/L (ref 134–144)

## 2019-12-14 NOTE — Progress Notes (Signed)
Cardiology Office Note:    Date:  12/14/2019   ID:  Eddie [name], DOB 1951/05/18, MRN [id number]  PCP:  Street, Sharon Mt, MD  Cardiologist:  Jenean Lindau, MD   Referring MD: Emmaline Kluver, *    ASSESSMENT:    No diagnosis found. PLAN:    In order of problems listed above:  1. Primary prevention stressed with the patient.  Importance of compliance with diet medication stressed and he vocalized understanding. 2. Essential hypertension: Blood pressure stable and patient has now increased medicine to lisinopril 10 mg daily.  He will have a Chem-7 today. 3. Paroxysmal atrial fibrillation:I discussed with the patient atrial fibrillation, disease process. Management and therapy including rate and rhythm control, anticoagulation benefits and potential risks were discussed extensively with the patient. Patient had multiple questions which were answered to patient's satisfaction.  Patient's CHADS2 score is 1 so he is not on anticoagulation. 4. Patient will be seen in follow-up appointment in 9 months or earlier if the patient has any concerns    Medication Adjustments/Labs and Tests Ordered: Current medicines are reviewed at length with the patient today.  Concerns regarding medicines are outlined above.  No orders of the defined types were placed in this encounter.  No orders of the defined types were placed in this encounter.    Chief Complaint  Patient presents with  . Follow-up     History of Present Illness:    Eddie [name] is a 69 y.o. male.  Patient has past medical history of paroxysmal defibrillation and essential hypertension.  He denies any problems at this time and takes care of activities of daily living.  No chest pain orthopnea or PND.  He has done very well to exercise and lose weight on a regular basis.  At the time of my evaluation, the patient is alert awake oriented and in no distress.  I discussed my findings with the patient at length  today.  Past Medical History:  Diagnosis Date  . Abnormal EKG 09/24/2019  . Cardiac murmur 09/24/2019  . Essential hypertension 09/24/2019  . Organic impotence 06/29/2019  . Paroxysmal atrial fibrillation ([place]) 11/15/2019    Past Surgical History:  Procedure Laterality Date  . KNEE SURGERY Right     Current Medications: Current Meds  Medication Sig  . aspirin 81 MG EC tablet Take by mouth.  . diphenhydramine-acetaminophen (TYLENOL PM) 25-500 MG TABS tablet Take 1 tablet by mouth at bedtime as needed.  Marland Kitchen lisinopril (ZESTRIL) 10 MG tablet Take by mouth daily.   . MELOXICAM PO Take by mouth.  . tamsulosin (FLOMAX) 0.4 MG CAPS capsule TAKE 1 CAPSULE BY MOUTH EVERY DAY     Allergies:   Latex   Social History   Socioeconomic History  . Marital status: Married    Spouse name: Not on file  . Number of children: Not on file  . Years of education: Not on file  . Highest education level: Not on file  Occupational History  . Not on file  Tobacco Use  . Smoking status: Former Smoker    Types: Cigarettes    Quit date: 09/24/1987    Years since quitting: 32.2  . Smokeless tobacco: Former Systems developer    Quit date: 09/24/1987  Substance and Sexual Activity  . Alcohol use: Never  . Drug use: Never  . Sexual activity: Not on file  Other Topics Concern  . Not on file  Social History Narrative  . Not on file  Social Determinants of Health   Financial Resource Strain:   . Difficulty of Paying Living Expenses:   Food Insecurity:   . Worried About Charity fundraiser in the Last Year:   . Arboriculturist in the Last Year:   Transportation Needs:   . Film/video editor (Medical):   Marland Kitchen Lack of Transportation (Non-Medical):   Physical Activity:   . Days of Exercise per Week:   . Minutes of Exercise per Session:   Stress:   . Feeling of Stress :   Social Connections:   . Frequency of Communication with Friends and Family:   . Frequency of Social Gatherings with Friends and Family:    . Attends Religious Services:   . Active Member of Clubs or Organizations:   . Attends Archivist Meetings:   Marland Kitchen Marital Status:      Family History: The patient's family history is not on file.  ROS:   Please see the history of present illness.    All other systems reviewed and are negative.  EKGs/Labs/Other Studies Reviewed:    The following studies were reviewed today: EVENT MONITOR REPORT:   Patient was monitored from 10/27/2019 to 11/25/2019. Indication:                    Dizziness and giddiness Ordering physician:  Jenean Lindau, MD  Referring physician:        Jenean Lindau, MD    Baseline rhythm: Sinus  Minimum heart rate: 52 BPM. Maximal heart rate 145 BPM.  Baseline strip at a heart rate of 65.  Atrial arrhythmia: None significant  Ventricular arrhythmia: None significant  Conduction abnormality: None significant  Symptoms: None significant   Conclusion:  Event monitoring was unremarkable and within normal limits.   Recent Labs: No results found for requested labs within last 8760 hours.  Recent Lipid Panel No results found for: CHOL, TRIG, HDL, CHOLHDL, VLDL, LDLCALC, LDLDIRECT  Physical Exam:    VS:  BP 130/76   Pulse 72   Ht 5\' 10"  (1.778 m)   Wt 220 lb (99.8 kg)   SpO2 96%   BMI 31.57 kg/m     Wt Readings from Last 3 Encounters:  12/14/19 220 lb (99.8 kg)  11/15/19 225 lb 9.6 oz (102.3 kg)  09/24/19 232 lb (105.2 kg)     GEN: Patient is in no acute distress HEENT: Normal NECK: No JVD; No carotid bruits LYMPHATICS: No lymphadenopathy CARDIAC: Hear sounds regular, 2/6 systolic murmur at the apex. RESPIRATORY:  Clear to auscultation without rales, wheezing or rhonchi  ABDOMEN: Soft, non-tender, non-distended MUSCULOSKELETAL:  No edema; No deformity  SKIN: Warm and dry NEUROLOGIC:  Alert and oriented x 3 PSYCHIATRIC:  Normal affect   Signed, Jenean Lindau, MD  12/14/2019 9:14 AM    [place]

## 2019-12-14 NOTE — Patient Instructions (Signed)
Medication Instructions:  No medication changes *If you need a refill on your cardiac medications before your next appointment, please call your pharmacy*   Lab Work: Your physician recommends that you have a BMP today.  If you have labs (blood work) drawn today and your tests are completely normal, you will receive your results only by: Marland Kitchen MyChart Message (if you have MyChart) OR . A paper copy in the mail If you have any lab test that is abnormal or we need to change your treatment, we will call you to review the results.   Testing/Procedures: None ordered   Follow-Up: At [facility], you and your health needs are our priority.  As part of our continuing mission to provide you with exceptional heart care, we have created designated Provider Care Teams.  These Care Teams include your primary Cardiologist (physician) and Advanced Practice Providers (APPs -  Physician Assistants and Nurse Practitioners) who all work together to provide you with the care you need, when you need it.  We recommend signing up for the patient portal called "MyChart".  Sign up information is provided on this After Visit Summary.  MyChart is used to connect with patients for Virtual Visits (Telemedicine).  Patients are able to view lab/test results, encounter notes, upcoming appointments, etc.  Non-urgent messages can be sent to your provider as well.   To learn more about what you can do with MyChart, go to [email or web address].    Your next appointment:   9 month(s)  The format for your next appointment:   In Person  Provider:   Jyl Heinz, MD   Other Instructions NA

## 2019-12-15 ENCOUNTER — Telehealth: Payer: Self-pay

## 2019-12-15 NOTE — Telephone Encounter (Signed)
Spoke with patient regarding results and recommendation.  Patient verbalizes understanding and is agreeable to plan of care. Advised patient to call back with any issues or concerns.  

## 2019-12-15 NOTE — Telephone Encounter (Signed)
-----   Message from Jenean Lindau, MD sent at 12/14/2019  4:42 PM EDT ----- The results of the study is unremarkable. Please inform patient. I will discuss in detail at next appointment. Cc  primary care/referring physician Jenean Lindau, MD 12/14/2019 4:41 PM

## 2020-02-10 DIAGNOSIS — Z03818 Encounter for observation for suspected exposure to other biological agents ruled out: Secondary | ICD-10-CM | POA: Diagnosis not present

## 2020-02-10 DIAGNOSIS — Z20822 Contact with and (suspected) exposure to covid-19: Secondary | ICD-10-CM | POA: Diagnosis not present

## 2020-03-02 DIAGNOSIS — M5432 Sciatica, left side: Secondary | ICD-10-CM | POA: Diagnosis not present

## 2020-03-02 DIAGNOSIS — Z6834 Body mass index (BMI) 34.0-34.9, adult: Secondary | ICD-10-CM | POA: Diagnosis not present

## 2020-03-13 DIAGNOSIS — M545 Low back pain: Secondary | ICD-10-CM | POA: Diagnosis not present

## 2020-03-24 DIAGNOSIS — M5136 Other intervertebral disc degeneration, lumbar region: Secondary | ICD-10-CM | POA: Diagnosis not present

## 2020-03-24 DIAGNOSIS — M545 Low back pain: Secondary | ICD-10-CM | POA: Diagnosis not present

## 2020-03-24 DIAGNOSIS — M419 Scoliosis, unspecified: Secondary | ICD-10-CM | POA: Diagnosis not present

## 2020-04-24 DIAGNOSIS — M5416 Radiculopathy, lumbar region: Secondary | ICD-10-CM | POA: Diagnosis not present

## 2020-04-24 DIAGNOSIS — M5442 Lumbago with sciatica, left side: Secondary | ICD-10-CM | POA: Diagnosis not present

## 2020-04-24 DIAGNOSIS — M2560 Stiffness of unspecified joint, not elsewhere classified: Secondary | ICD-10-CM | POA: Diagnosis not present

## 2020-04-24 DIAGNOSIS — R531 Weakness: Secondary | ICD-10-CM | POA: Diagnosis not present

## 2020-04-27 DIAGNOSIS — M5442 Lumbago with sciatica, left side: Secondary | ICD-10-CM | POA: Diagnosis not present

## 2020-04-27 DIAGNOSIS — M2560 Stiffness of unspecified joint, not elsewhere classified: Secondary | ICD-10-CM | POA: Diagnosis not present

## 2020-04-27 DIAGNOSIS — M5416 Radiculopathy, lumbar region: Secondary | ICD-10-CM | POA: Diagnosis not present

## 2020-04-27 DIAGNOSIS — R531 Weakness: Secondary | ICD-10-CM | POA: Diagnosis not present

## 2020-05-01 DIAGNOSIS — M2560 Stiffness of unspecified joint, not elsewhere classified: Secondary | ICD-10-CM | POA: Diagnosis not present

## 2020-05-01 DIAGNOSIS — R531 Weakness: Secondary | ICD-10-CM | POA: Diagnosis not present

## 2020-05-01 DIAGNOSIS — M5416 Radiculopathy, lumbar region: Secondary | ICD-10-CM | POA: Diagnosis not present

## 2020-05-01 DIAGNOSIS — M5442 Lumbago with sciatica, left side: Secondary | ICD-10-CM | POA: Diagnosis not present

## 2020-05-04 DIAGNOSIS — R531 Weakness: Secondary | ICD-10-CM | POA: Diagnosis not present

## 2020-05-04 DIAGNOSIS — M2560 Stiffness of unspecified joint, not elsewhere classified: Secondary | ICD-10-CM | POA: Diagnosis not present

## 2020-05-04 DIAGNOSIS — S92424A Nondisplaced fracture of distal phalanx of right great toe, initial encounter for closed fracture: Secondary | ICD-10-CM | POA: Diagnosis not present

## 2020-05-04 DIAGNOSIS — M5442 Lumbago with sciatica, left side: Secondary | ICD-10-CM | POA: Diagnosis not present

## 2020-05-04 DIAGNOSIS — M5416 Radiculopathy, lumbar region: Secondary | ICD-10-CM | POA: Diagnosis not present

## 2020-05-04 DIAGNOSIS — M79672 Pain in left foot: Secondary | ICD-10-CM | POA: Diagnosis not present

## 2020-05-04 DIAGNOSIS — S92912A Unspecified fracture of left toe(s), initial encounter for closed fracture: Secondary | ICD-10-CM | POA: Diagnosis not present

## 2020-05-08 DIAGNOSIS — M5416 Radiculopathy, lumbar region: Secondary | ICD-10-CM | POA: Diagnosis not present

## 2020-05-08 DIAGNOSIS — M2560 Stiffness of unspecified joint, not elsewhere classified: Secondary | ICD-10-CM | POA: Diagnosis not present

## 2020-05-08 DIAGNOSIS — R531 Weakness: Secondary | ICD-10-CM | POA: Diagnosis not present

## 2020-05-08 DIAGNOSIS — M5442 Lumbago with sciatica, left side: Secondary | ICD-10-CM | POA: Diagnosis not present

## 2020-05-11 DIAGNOSIS — R531 Weakness: Secondary | ICD-10-CM | POA: Diagnosis not present

## 2020-05-11 DIAGNOSIS — M5442 Lumbago with sciatica, left side: Secondary | ICD-10-CM | POA: Diagnosis not present

## 2020-05-11 DIAGNOSIS — M2560 Stiffness of unspecified joint, not elsewhere classified: Secondary | ICD-10-CM | POA: Diagnosis not present

## 2020-05-11 DIAGNOSIS — M5416 Radiculopathy, lumbar region: Secondary | ICD-10-CM | POA: Diagnosis not present

## 2020-05-12 DIAGNOSIS — M419 Scoliosis, unspecified: Secondary | ICD-10-CM | POA: Diagnosis not present

## 2020-05-12 DIAGNOSIS — M5136 Other intervertebral disc degeneration, lumbar region: Secondary | ICD-10-CM | POA: Diagnosis not present

## 2020-05-18 DIAGNOSIS — M5442 Lumbago with sciatica, left side: Secondary | ICD-10-CM | POA: Diagnosis not present

## 2020-05-18 DIAGNOSIS — M2560 Stiffness of unspecified joint, not elsewhere classified: Secondary | ICD-10-CM | POA: Diagnosis not present

## 2020-05-18 DIAGNOSIS — R531 Weakness: Secondary | ICD-10-CM | POA: Diagnosis not present

## 2020-05-18 DIAGNOSIS — M5416 Radiculopathy, lumbar region: Secondary | ICD-10-CM | POA: Diagnosis not present

## 2020-06-05 DIAGNOSIS — M545 Low back pain, unspecified: Secondary | ICD-10-CM | POA: Diagnosis not present

## 2020-06-12 DIAGNOSIS — M4310 Spondylolisthesis, site unspecified: Secondary | ICD-10-CM | POA: Diagnosis not present

## 2020-06-12 DIAGNOSIS — M545 Low back pain, unspecified: Secondary | ICD-10-CM | POA: Diagnosis not present

## 2020-11-16 ENCOUNTER — Telehealth: Payer: Self-pay | Admitting: Cardiology

## 2020-11-16 NOTE — Telephone Encounter (Signed)
Spoke with pt and his wife in the office. Kardia stripes sent in pt advice to Dr. Bettina Gavia.

## 2020-11-16 NOTE — Telephone Encounter (Signed)
Pt is currently in AFIB and experiencing weakness and dizziness. Pt's wife, Eddie [name] said that Dr. Lennox Pippins had said he wanted to see him while he is experiencing symptoms. Please advise.  Thank you  Best number to call is [phone]  [facility]

## 2021-01-11 NOTE — Progress Notes (Signed)
Cardiology Office Note:    Date:  01/12/2021   ID:  Eddie [name], DOB September 26, 1950, MRN [id number]  PCP:  Street, Sharon Mt, MD  Cardiologist:  Shirlee More, MD    Referring MD: [address] Sharon Mt, *    ASSESSMENT:    1. Paroxysmal atrial fibrillation (HCC)   2. Essential hypertension   3. Enlarged thoracic aorta (HCC)   4. Cardiac risk counseling   5. Exercise intolerance    PLAN:    In order of problems listed above:  Stable no recurrent atrial fibrillation I asked him to screen himself symptomatically and twice a week and to push strips to me through MyChart if he has probable or definitive atrial fibrillation.  At this time I would not place him on antiarrhythmic drug or anticoagulation without recurrence single episode in life. At target continue lisinopril  check his blood pressure sitting and standing several times a week and contact me if he gets systolics of less than 433 as he has mild lightheadedness standing Check CT scan Check coronary calcium score for decision making statin Advise cardiac CTA   Next appointment: 1 year   Medication Adjustments/Labs and Tests Ordered: Current medicines are reviewed at length with the patient today.  Concerns regarding medicines are outlined above.  No orders of the defined types were placed in this encounter.  No orders of the defined types were placed in this encounter.   Chief Complaint  Patient presents with   Atrial Fibrillation     History of Present Illness:    Eddie [name] is a 70 y.o. male with a hx of hypertension and paroxysmal atrial fibrillation last seen 12/14/2019.  I recently reviewed mobile EKG strips 11/16/2020 showing sinus rhythm.  Echocardiogram 11/05/2019 showed EF of 60 to 29% normal systolic and diastolic function left atrium is mildly to moderately enlarged and there is dilation of the aorta at the level of the sinus of Valsalva 42 mm.  Event monitor reported 12/01/2019 showed sinus  rhythm with no sustained arrhythmia.    Compliance with diet, lifestyle and medications: Yes  [place] Scrape are multiple issues today first he brings strips from his cardia mobile device I reviewed greater than 10 days had no recurrence of atrial fibrillation\second he is concerned about his risk for heart disease he has developed exercise intolerance and when he pushes himself hard he has to stop and rest is concerned he has CAD.  He had a stress test in the past that was normal he does not have faith in the technology he has enlargement ascending aorta and after discussion advised to undergo cardiac CTA and he is going to go home and speak with his wife.  This will also give [place] knowledge if he has a high calcium score needs to start lipid-lowering treatment reassess his thoracic aorta this enlarged and define of obstructive CAD as the cause of his new exercise intolerance.  He is not having chest pain or shortness of breath. 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   diphenhydramine-acetaminophen (TYLENOL PM) 25-500 MG TABS tablet Take 1 tablet by mouth at bedtime as needed (SLEEP).  lisinopril (ZESTRIL) 10 MG tablet Take by mouth daily.    MELOXICAM PO Take by mouth.   tamsulosin (FLOMAX) 0.4 MG CAPS capsule TAKE 1 CAPSULE BY MOUTH EVERY DAY     Allergies:   Latex   Social History   Socioeconomic History   Marital status: Married    Spouse name: Not on file   Number of children: Not on file   Years of education: Not on file   Highest education level: Not on file  Occupational History   Not on file  Tobacco Use   Smoking status: Former    Pack years:  0.00    Types: Cigarettes    Quit date: 09/24/1987    Years since quitting: 33.3   Smokeless tobacco: Former    Quit date: 09/24/1987  Substance and Sexual Activity   Alcohol use: Never   Drug use: Never   Sexual activity: Not on file  Other Topics Concern   Not on file  Social History Narrative   Not on file   Social Determinants of Health   Financial Resource Strain: Not on file  Food Insecurity: Not on file  Transportation Needs: Not on file  Physical Activity: Not on file  Stress: Not on file  Social Connections: Not on file     Family History: The patient's family history includes Alcoholism in his sister; Cancer in his sister; Heart Problems in his brother; Leukemia in his father; Stroke in his brother and mother. ROS:   Please see the history of present illness.    All other systems reviewed and are negative.  EKGs/Labs/Other Studies Reviewed:    The following studies were reviewed today:  EKG:  EKG ordered today and personally reviewed.  The ekg ordered today demonstrates sinus rhythm and is normal  Recent Labs: 03/20/2018 cholesterol 159 LDL 97 triglycerides HDL 47 A1c 5.9%  Physical Exam:    VS:  BP 118/62 (BP Location: Left Arm, Patient Position: Sitting, Cuff Size: Normal)   Pulse 76   Ht 5\' 10"  (1.778 m)   Wt 219 lb 9.6 oz (99.6 kg)   SpO2 98%   BMI 31.51 kg/m     Wt Readings from Last 3 Encounters:  01/12/21 219 lb 9.6 oz (99.6 kg)  12/14/19 220 lb (99.8 kg)  11/15/19 225 lb 9.6 oz (102.3 kg)     GEN:  Well nourished, well developed in no acute distress HEENT: Normal NECK: No JVD; No carotid bruits LYMPHATICS: No lymphadenopathy CARDIAC: RRR, no murmurs, rubs, gallops RESPIRATORY:  Clear to auscultation without rales, wheezing or rhonchi  ABDOMEN: Soft, non-tender, non-distended MUSCULOSKELETAL:  No edema; No deformity  SKIN: Warm and dry NEUROLOGIC:  Alert and oriented x 3 PSYCHIATRIC:  Normal affect    Signed, Shirlee More, MD   01/12/2021 1:58 PM    [facility]

## 2021-01-12 ENCOUNTER — Ambulatory Visit: Payer: PPO | Admitting: Cardiology

## 2021-01-12 ENCOUNTER — Other Ambulatory Visit: Payer: Self-pay

## 2021-01-12 ENCOUNTER — Encounter: Payer: Self-pay | Admitting: Cardiology

## 2021-01-12 VITALS — BP 118/62 | HR 76 | Ht 70.0 in | Wt 219.6 lb

## 2021-01-12 DIAGNOSIS — R6889 Other general symptoms and signs: Secondary | ICD-10-CM

## 2021-01-12 DIAGNOSIS — I480 Paroxysmal atrial fibrillation: Secondary | ICD-10-CM

## 2021-01-12 DIAGNOSIS — I10 Essential (primary) hypertension: Secondary | ICD-10-CM | POA: Diagnosis not present

## 2021-01-12 DIAGNOSIS — I7789 Other specified disorders of arteries and arterioles: Secondary | ICD-10-CM | POA: Diagnosis not present

## 2021-01-12 DIAGNOSIS — Z7189 Other specified counseling: Secondary | ICD-10-CM | POA: Diagnosis not present

## 2021-01-12 NOTE — Patient Instructions (Signed)
Medication Instructions:  No medication changes. *If you need a refill on your cardiac medications before your next appointment, please call your pharmacy*   Lab Work: Your physician recommends that you return for lab work in: 1 week before your CT scan for a BMET. You will not need an appointment or to fast.  If you have labs (blood work) drawn today and your tests are completely normal, you will receive your results only by: [place] (if you have MyChart) OR A paper copy in the mail If you have any lab test that is abnormal or we need to change your treatment, we will call you to review the results.   Testing/Procedures: Your cardiac CT will be scheduled at:   [facility] [place], [place] [address] [phone]   If scheduled at [facility], please arrive at the [facility] main entrance of [facility] 30 minutes prior to test start time. Proceed to the [facility] Radiology Department (first floor) to check-in and test prep.  Please follow these instructions carefully (unless otherwise directed):  Hold all erectile dysfunction medications at least 3 days (72 hrs) prior to test.  On the Night Before the Test: Be sure to Drink plenty of water. Do not consume any caffeinated/decaffeinated beverages or chocolate 12 hours prior to your test. Do not take any antihistamines 12 hours prior to your test.   On the Day of the Test: Drink plenty of water. Do not drink any water within one hour of the test. Do not eat any food 4 hours prior to the test. You may take your regular medications prior to the test.  Take metoprolol (Lopressor) two hours prior to test.       After the Test: Drink plenty of water. After receiving IV contrast, you may experience a mild flushed feeling. This is normal. On occasion, you may experience a mild rash up to 24 hours after the test. This is not dangerous. If this occurs, you can take Benadryl 25 mg  and increase your fluid intake. If you experience trouble breathing, this can be serious. If it is severe call 911 IMMEDIATELY. If it is mild, please call our office. If you take any of these medications: Glipizide/Metformin, Avandament, Glucavance, please do not take 48 hours after completing test unless otherwise instructed.   Once we have confirmed authorization from your insurance company, we will call you to set up a date and time for your test. Based on how quickly your insurance processes prior authorizations requests, please allow up to 4 weeks to be contacted for scheduling your Cardiac CT appointment. Be advised that routine Cardiac CT appointments could be scheduled as many as 8 weeks after your provider has ordered it.  For non-scheduling related questions, please contact the cardiac imaging nurse navigator should you have any questions/concerns: Marchia Bond, Cardiac Imaging Nurse Navigator Burley Saver, Interim Cardiac Imaging Nurse [place] and Vascular Services Direct Office Dial: [phone]   For scheduling needs, including cancellations and rescheduling, please call Vivien Rota at [phone].     Follow-Up: At [facility], you and your health needs are our priority.  As part of our continuing mission to provide you with exceptional heart care, we have created designated Provider Care Teams.  These Care Teams include your primary Cardiologist (physician) and Advanced Practice Providers (APPs -  Physician Assistants and Nurse Practitioners) who all work together to provide you with the care you need, when you need  it.  We recommend signing up for the patient portal called "MyChart".  Sign up information is provided on this After Visit Summary.  MyChart is used to connect with patients for Virtual Visits (Telemedicine).  Patients are able to view lab/test results, encounter notes, upcoming appointments, etc.  Non-urgent messages can be sent to your provider as well.    To learn more about what you can do with MyChart, go to [email or web address].    Your next appointment:   12 month(s)  The format for your next appointment:   In Person  Provider:   Shirlee More, MD   Other Instructions Cardiac CT Angiogram A cardiac CT angiogram is a procedure to look at the heart and the area around the heart. It may be done to help find the cause of chest pains or other symptoms of heart disease. During this procedure, a substance called contrast dye is injected into the blood vessels in the area to be checked. A large X-ray machine, called a CT scanner, then takes detailed pictures of the heart and the surrounding area. The procedure is also sometimes called a coronary CT angiogram, coronary artery scanning, or CTA. A cardiac CT angiogram allows the health care provider to see how well blood is flowing to and from the heart. The health care provider will be able to see if there are any problems, such as: Blockage or narrowing of the coronary arteries in the heart. Fluid around the heart. Signs of weakness or disease in the muscles, valves, and tissues of the heart. Tell a health care provider about: Any allergies you have. This is especially important if you have had a previous allergic reaction to contrast dye. All medicines you are taking, including vitamins, herbs, eye drops, creams, and over-the-counter medicines. Any blood disorders you have. Any surgeries you have had. Any medical conditions you have. Whether you are pregnant or may be pregnant. Any anxiety disorders, chronic pain, or other conditions you have that may increase your stress or prevent you from lying still. What are the risks? Generally, this is a safe procedure. However, problems may occur, including: Bleeding. Infection. Allergic reactions to medicines or dyes. Damage to other structures or organs. Kidney damage from the contrast dye that is used. Increased risk of cancer from  radiation exposure. This risk is low. Talk with your health care provider about: The risks and benefits of testing. How you can receive the lowest dose of radiation. What happens before the procedure? Wear comfortable clothing and remove any jewelry, glasses, dentures, and hearing aids. Follow instructions from your health care provider about eating and drinking. This may include: For 12 hours before the procedure -- avoid caffeine. This includes tea, coffee, soda, energy drinks, and diet pills. Drink plenty of water or other fluids that do not have caffeine in them. Being well hydrated can prevent complications. For 4-6 hours before the procedure -- stop eating and drinking. The contrast dye can cause nausea, but this is less likely if your stomach is empty. Ask your health care provider about changing or stopping your regular medicines. This is especially important if you are taking diabetes medicines, blood thinners, or medicines to treat problems with erections (erectile dysfunction). What happens during the procedure?  Hair on your chest may need to be removed so that small sticky patches called electrodes can be placed on your chest. These will transmit information that helps to monitor your heart during the procedure. An IV will be inserted into one of your  veins. You might be given a medicine to control your heart rate during the procedure. This will help to ensure that good images are obtained. You will be asked to lie on an exam table. This table will slide in and out of the CT machine during the procedure. Contrast dye will be injected into the IV. You might feel warm, or you may get a metallic taste in your mouth. You will be given a medicine called nitroglycerin. This will relax or dilate the arteries in your heart. The table that you are lying on will move into the CT machine tunnel for the scan. The person running the machine will give you instructions while the scans are being done.  You may be asked to: Keep your arms above your head. Hold your breath. Stay very still, even if the table is moving. When the scanning is complete, you will be moved out of the machine. The IV will be removed. The procedure may vary among health care providers and hospitals. What can I expect after the procedure? After your procedure, it is common to have: A metallic taste in your mouth from the contrast dye. A feeling of warmth. A headache from the nitroglycerin. Follow these instructions at home: Take over-the-counter and prescription medicines only as told by your health care provider. If you are told, drink enough fluid to keep your urine pale yellow. This will help to flush the contrast dye out of your body. Most people can return to their normal activities right after the procedure. Ask your health care provider what activities are safe for you. It is up to you to get the results of your procedure. Ask your health care provider, or the department that is doing the procedure, when your results will be ready. Keep all follow-up visits as told by your health care provider. This is important. Contact a health care provider if: You have any symptoms of allergy to the contrast dye. These include: Shortness of breath. Rash or hives. A racing heartbeat. Summary A cardiac CT angiogram is a procedure to look at the heart and the area around the heart. It may be done to help find the cause of chest pains or other symptoms of heart disease. During this procedure, a large X-ray machine, called a CT scanner, takes detailed pictures of the heart and the surrounding area after a contrast dye has been injected into blood vessels in the area. Ask your health care provider about changing or stopping your regular medicines before the procedure. This is especially important if you are taking diabetes medicines, blood thinners, or medicines to treat erectile dysfunction. If you are told, drink enough  fluid to keep your urine pale yellow. This will help to flush the contrast dye out of your body. This information is not intended to replace advice given to you by your health care provider. Make sure you discuss any questions you have with your health care provider. Document Revised: 03/10/2019 Document Reviewed: 03/10/2019 Elsevier Patient Education  [place].

## 2021-01-16 ENCOUNTER — Other Ambulatory Visit: Payer: Self-pay

## 2021-01-16 ENCOUNTER — Telehealth: Payer: Self-pay | Admitting: Cardiology

## 2021-01-16 DIAGNOSIS — I10 Essential (primary) hypertension: Secondary | ICD-10-CM

## 2021-01-16 DIAGNOSIS — I7789 Other specified disorders of arteries and arterioles: Secondary | ICD-10-CM

## 2021-01-16 DIAGNOSIS — R6889 Other general symptoms and signs: Secondary | ICD-10-CM

## 2021-01-16 DIAGNOSIS — Z7189 Other specified counseling: Secondary | ICD-10-CM

## 2021-01-16 DIAGNOSIS — I480 Paroxysmal atrial fibrillation: Secondary | ICD-10-CM

## 2021-01-16 MED ORDER — METOPROLOL TARTRATE 100 MG PO TABS: 100.0000 mg | ORAL_TABLET | Freq: Once | ORAL | 0 refills | Status: DC

## 2021-01-16 NOTE — Telephone Encounter (Signed)
Spoke to the patient just now and let him know that I have placed the order for this CT and they will be calling him to schedule this.    His blood pressure at [facility] was 102/71 two hours after he was outside push mowing. He got dizzy while push mowing and that is why he went to [facility] to check his blood pressure, He bought a blood pressure cuff and I advised that he take his blood pressure once daily both sitting and standing and to keep a log of these blood pressures for [place]. He states that he will do this.   Encouraged patient to call back with any questions or concerns.

## 2021-01-16 NOTE — Telephone Encounter (Signed)
    Pt is calling, he decided to get the cardiac CT to be done at [facility] hospital. No order on file yet

## 2021-01-17 ENCOUNTER — Telehealth (HOSPITAL_COMMUNITY): Payer: Self-pay | Admitting: Emergency Medicine

## 2021-01-17 ENCOUNTER — Telehealth: Payer: Self-pay

## 2021-01-17 ENCOUNTER — Other Ambulatory Visit (HOSPITAL_COMMUNITY): Payer: Self-pay | Admitting: Emergency Medicine

## 2021-01-17 DIAGNOSIS — I7789 Other specified disorders of arteries and arterioles: Secondary | ICD-10-CM

## 2021-01-17 LAB — BASIC METABOLIC PANEL
BUN/Creatinine Ratio: 14 (ref 10–24)
BUN: 13 mg/dL (ref 8–27)
CO2: 20 mmol/L (ref 20–29)
Calcium: 9 mg/dL (ref 8.6–10.2)
Chloride: 105 mmol/L (ref 96–106)
Creatinine, Ser: 0.94 mg/dL (ref 0.76–1.27)
Glucose: 100 mg/dL — ABNORMAL HIGH (ref 65–99)
Potassium: 4.3 mmol/L (ref 3.5–5.2)
Sodium: 140 mmol/L (ref 134–144)
eGFR: 88 mL/min/{1.73_m2} (ref >59)

## 2021-01-17 NOTE — Telephone Encounter (Signed)
-----   Message from Berniece Salines, DO sent at 01/17/2021  8:03 AM EDT ----- This is Dr. Joya Gaskins patient: Labs stable

## 2021-01-17 NOTE — Telephone Encounter (Signed)
Spoke with patient regarding results and recommendation.  Patient verbalizes understanding and is agreeable to plan of care. Advised patient to call back with any issues or concerns.  

## 2021-01-17 NOTE — Progress Notes (Signed)
Adding CTA Aorta to be done with coronary CTA to evaluate enlarged aorta. Marchia Bond RN Navigator Cardiac Imaging [facility] Heart and Vascular Services [phone] Office  [phone] Cell

## 2021-01-17 NOTE — Telephone Encounter (Signed)
Pts wife states that he is having an episode and would like to know if she should have him to come in today... please advise

## 2021-01-17 NOTE — Telephone Encounter (Signed)
Reaching out to patient to offer assistance regarding upcoming cardiac imaging study; pt verbalizes understanding of appt date/time, parking situation and where to check in, pre-test NPO status and medications ordered, and verified current allergies; name and call back number provided for further questions should they arise Marchia Bond RN [place] and Vascular [phone] office [phone] cell  100mg  metoprolol tart 2 hr prior

## 2021-01-19 ENCOUNTER — Ambulatory Visit (HOSPITAL_COMMUNITY)
Admission: RE | Admit: 2021-01-19 | Discharge: 2021-01-19 | Disposition: A | Discharge status: Home / Self Care | Payer: PPO | Source: Ambulatory Visit | Attending: Cardiology | Admitting: Cardiology

## 2021-01-19 ENCOUNTER — Other Ambulatory Visit: Payer: Self-pay

## 2021-01-19 DIAGNOSIS — Z7189 Other specified counseling: Secondary | ICD-10-CM | POA: Diagnosis not present

## 2021-01-19 DIAGNOSIS — I480 Paroxysmal atrial fibrillation: Secondary | ICD-10-CM | POA: Insufficient documentation

## 2021-01-19 DIAGNOSIS — I7789 Other specified disorders of arteries and arterioles: Secondary | ICD-10-CM | POA: Diagnosis not present

## 2021-01-19 DIAGNOSIS — R6889 Other general symptoms and signs: Secondary | ICD-10-CM | POA: Insufficient documentation

## 2021-01-19 DIAGNOSIS — R911 Solitary pulmonary nodule: Secondary | ICD-10-CM | POA: Diagnosis not present

## 2021-01-19 MED ORDER — NITROGLYCERIN 0.4 MG SL SUBL
SUBLINGUAL_TABLET | SUBLINGUAL | Status: AC
  Filled 2021-01-19: qty 2

## 2021-01-19 MED ORDER — NITROGLYCERIN 0.4 MG SL SUBL
0.8000 mg | SUBLINGUAL_TABLET | Freq: Once | SUBLINGUAL | Status: AC
  Administered 2021-01-19: 0.8 mg via SUBLINGUAL

## 2021-01-19 MED ORDER — IOHEXOL 350 MG/ML SOLN
100.0000 mL | Freq: Once | INTRAVENOUS | Status: AC | PRN
  Administered 2021-01-19: 100 mL via INTRAVENOUS

## 2021-01-22 ENCOUNTER — Telehealth: Payer: Self-pay

## 2021-01-22 MED ORDER — PRAVASTATIN SODIUM 20 MG PO TABS: 20.0000 mg | ORAL_TABLET | Freq: Every evening | ORAL | 3 refills | Status: DC

## 2021-01-22 NOTE — Telephone Encounter (Signed)
Spoke with patient regarding results and recommendation.  Patient verbalizes understanding and is agreeable to plan of care. Advised patient to call back with any issues or concerns.  

## 2021-01-22 NOTE — Telephone Encounter (Signed)
-----   Message from Richardo Priest, MD sent at 01/21/2021 10:28 AM EDT ----- His overall score is mildly elevated but not 0 He has mild less than 25% stenosis of the coronary arteries I know that his lipids and LDL are good but he should be taking statin and I put him on low-dose pravastatin 20 mg daily if he is agreeable We can discuss further at office follow-up I went back and reviewed his echocardiogram I do not think we need to repeat it.

## 2021-01-27 DIAGNOSIS — R5381 Other malaise: Secondary | ICD-10-CM | POA: Diagnosis not present

## 2021-01-30 DIAGNOSIS — R739 Hyperglycemia, unspecified: Secondary | ICD-10-CM | POA: Diagnosis not present

## 2021-01-30 DIAGNOSIS — E559 Vitamin D deficiency, unspecified: Secondary | ICD-10-CM | POA: Diagnosis not present

## 2021-01-30 DIAGNOSIS — Z79899 Other long term (current) drug therapy: Secondary | ICD-10-CM | POA: Diagnosis not present

## 2021-01-30 DIAGNOSIS — E782 Mixed hyperlipidemia: Secondary | ICD-10-CM | POA: Diagnosis not present

## 2021-01-30 DIAGNOSIS — Z125 Encounter for screening for malignant neoplasm of prostate: Secondary | ICD-10-CM | POA: Diagnosis not present

## 2021-01-30 DIAGNOSIS — E611 Iron deficiency: Secondary | ICD-10-CM | POA: Diagnosis not present

## 2021-02-01 DIAGNOSIS — N138 Other obstructive and reflux uropathy: Secondary | ICD-10-CM | POA: Diagnosis not present

## 2021-02-01 DIAGNOSIS — E669 Obesity, unspecified: Secondary | ICD-10-CM | POA: Diagnosis not present

## 2021-02-01 DIAGNOSIS — Z9181 History of falling: Secondary | ICD-10-CM | POA: Diagnosis not present

## 2021-02-01 DIAGNOSIS — N401 Enlarged prostate with lower urinary tract symptoms: Secondary | ICD-10-CM | POA: Diagnosis not present

## 2021-02-01 DIAGNOSIS — Z0000 Encounter for general adult medical examination without abnormal findings: Secondary | ICD-10-CM | POA: Diagnosis not present

## 2021-02-01 DIAGNOSIS — I712 Thoracic aortic aneurysm, without rupture: Secondary | ICD-10-CM | POA: Diagnosis not present

## 2021-02-01 DIAGNOSIS — I10 Essential (primary) hypertension: Secondary | ICD-10-CM | POA: Diagnosis not present

## 2021-02-01 DIAGNOSIS — Z6831 Body mass index (BMI) 31.0-31.9, adult: Secondary | ICD-10-CM | POA: Diagnosis not present

## 2021-02-01 DIAGNOSIS — E782 Mixed hyperlipidemia: Secondary | ICD-10-CM | POA: Diagnosis not present

## 2021-02-01 DIAGNOSIS — N411 Chronic prostatitis: Secondary | ICD-10-CM | POA: Diagnosis not present

## 2021-02-01 DIAGNOSIS — Z1331 Encounter for screening for depression: Secondary | ICD-10-CM | POA: Diagnosis not present

## 2021-02-22 DIAGNOSIS — C44319 Basal cell carcinoma of skin of other parts of face: Secondary | ICD-10-CM | POA: Diagnosis not present

## 2021-08-02 DIAGNOSIS — S83242A Other tear of medial meniscus, current injury, left knee, initial encounter: Secondary | ICD-10-CM | POA: Diagnosis not present

## 2021-08-30 DIAGNOSIS — M25562 Pain in left knee: Secondary | ICD-10-CM | POA: Diagnosis not present

## 2021-09-03 DIAGNOSIS — M25562 Pain in left knee: Secondary | ICD-10-CM | POA: Diagnosis not present

## 2021-10-17 DIAGNOSIS — R197 Diarrhea, unspecified: Secondary | ICD-10-CM | POA: Diagnosis not present

## 2021-10-17 DIAGNOSIS — E860 Dehydration: Secondary | ICD-10-CM | POA: Diagnosis not present

## 2021-10-17 DIAGNOSIS — R1084 Generalized abdominal pain: Secondary | ICD-10-CM | POA: Diagnosis not present

## 2021-10-17 DIAGNOSIS — R109 Unspecified abdominal pain: Secondary | ICD-10-CM | POA: Diagnosis not present

## 2021-10-17 DIAGNOSIS — K6389 Other specified diseases of intestine: Secondary | ICD-10-CM | POA: Diagnosis not present

## 2021-10-17 DIAGNOSIS — R112 Nausea with vomiting, unspecified: Secondary | ICD-10-CM | POA: Diagnosis not present

## 2021-10-17 DIAGNOSIS — K529 Noninfective gastroenteritis and colitis, unspecified: Secondary | ICD-10-CM | POA: Diagnosis not present

## 2021-10-17 DIAGNOSIS — Z20822 Contact with and (suspected) exposure to covid-19: Secondary | ICD-10-CM | POA: Diagnosis not present

## 2021-11-14 ENCOUNTER — Other Ambulatory Visit: Payer: Self-pay | Admitting: Cardiology

## 2021-12-16 IMAGING — CT CT HEART MORP W/ CTA COR W/ SCORE W/ CA W/CM &/OR W/O CM
1 of 11 series · 8 of 20 positions shown, 10 images · IV contrast (APPLIED)
Comparison: None.
COMPARISON: None.

Addendum:
EXAM:
OVER-READ INTERPRETATION  CT CHEST

The following report is an over-read performed by radiologist Dr.
Sadayoshi Asson [REDACTED] on 01/19/2021. This
over-read does not include interpretation of cardiac or coronary
anatomy or pathology. The coronary calcium score/coronary CTA
interpretation by the cardiologist is attached.
HISTORY: 70 yo male with chest pain/anginal equiv, 10yr CHD risk > 20%, not
treadmill candidate, history of aortic aneurysm
Cardiac/Coronary CTA
TECHNIQUE: The patient was scanned on a Siemens Force scanner.
PROTOCOL: A 100 kV prospective scan was triggered in the descending thoracic
aorta at 111 HU's. Axial non-contrast 3 mm slices were carried out
through the heart. The data set was analyzed on a dedicated work
station and scored using the Agatson method. Gantry rotation speed
was 250 msecs and collimation was .6 mm. Beta blockade and 0.8 mg of
sl NTG was given. The 3D data set was reconstructed in 5% intervals
of the 67-82 % of the R-R cycle. Diastolic phases were analyzed on a
dedicated work station using MPR, MIP and VRT modes. The patient
received 100mL OMNIPAQUE IOHEXOL 350 MG/ML SOLN of contrast.

[Series 11: multiphase · axial · 0.39mm/px · z∈[+1210,+1322]mm · 8 of 2151 slices shown, 10 images]
[im 239/2151  vessel]
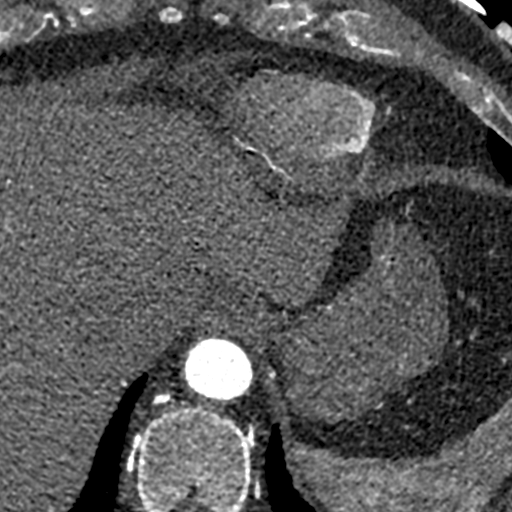
[im 239/2151  lung]
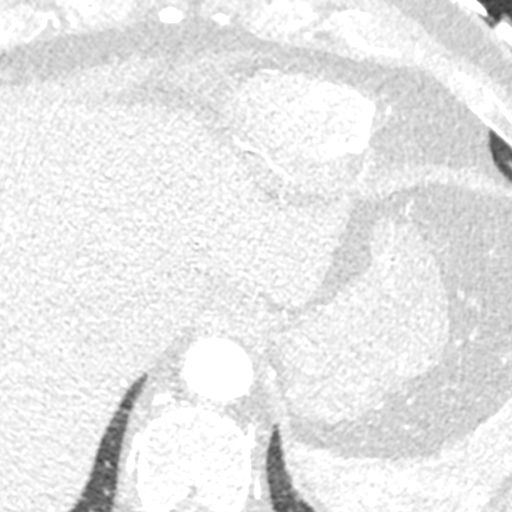
[im 478/2151  vessel]
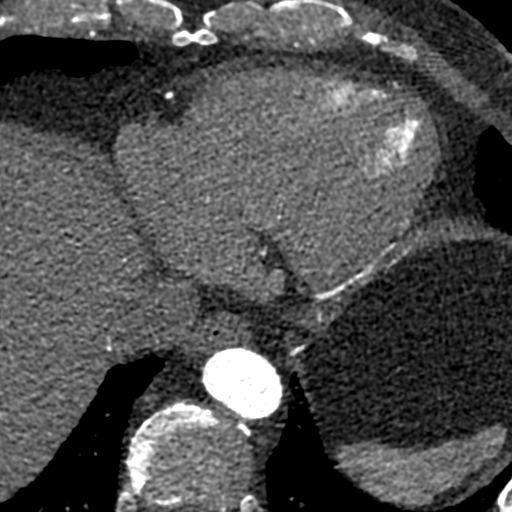
[im 717/2151  vessel]
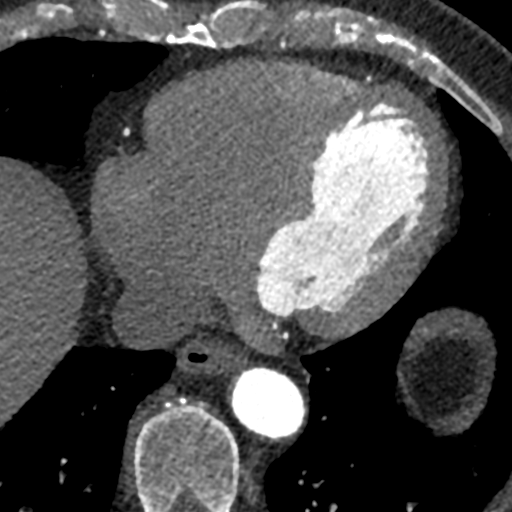
[im 956/2151  vessel]
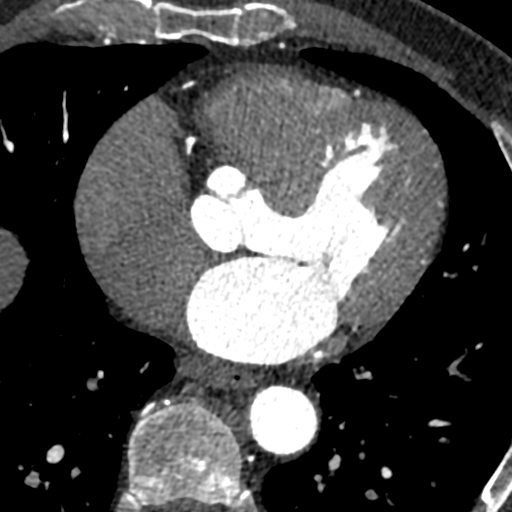
[im 1195/2151  vessel]
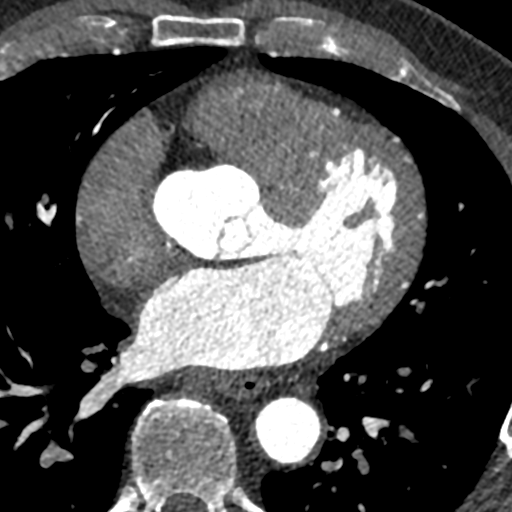
[im 1195/2151  lung]
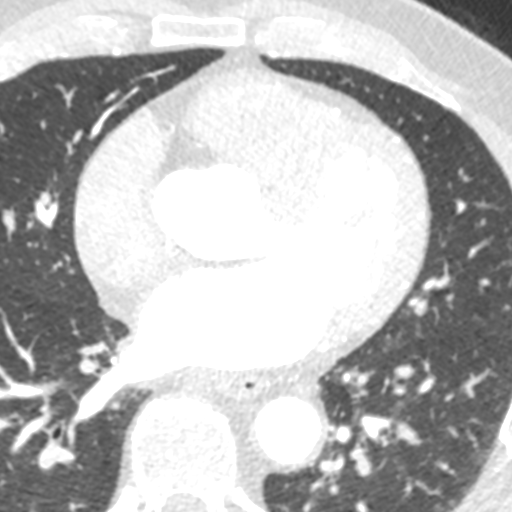
[im 1434/2151  vessel]
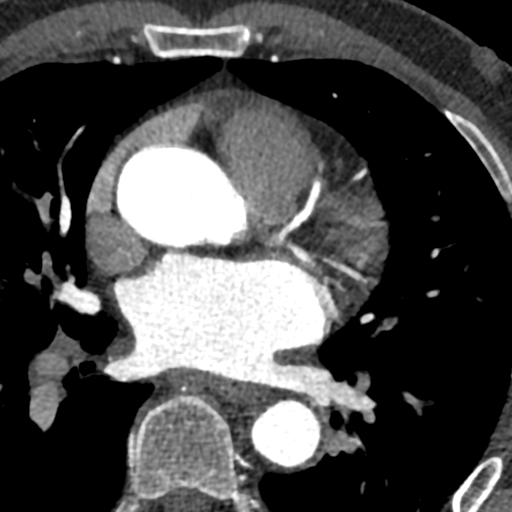
[im 1673/2151  vessel]
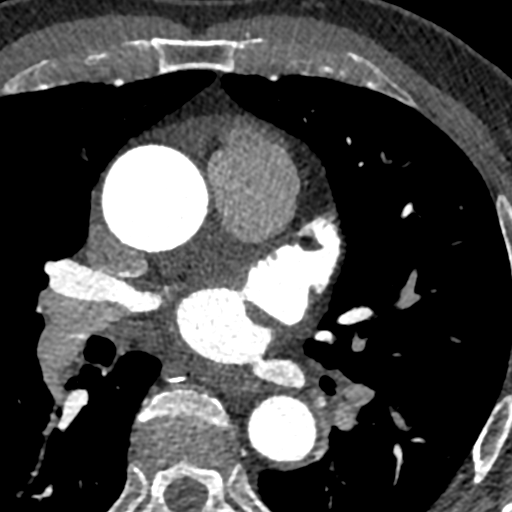
[im 1912/2151  vessel]
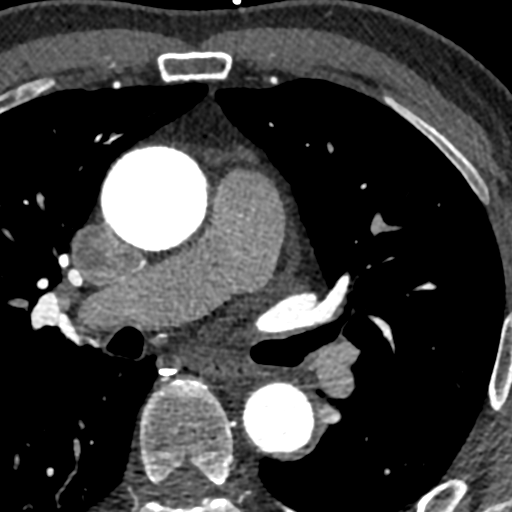

[8 of 20 positions shown; findings below may reference images not displayed]

FINDINGS: Vascular: Ascending aorta measures 4.2 cm. Heart is enlarged. Small
amount of pericardial fluid is likely physiologic.

Mediastinum/Nodes: None.

Lungs/Pleura: Tiny nodules in the right middle lobe measure up to 3
mm. No pleural fluid. Visualized airway is unremarkable.

Upper Abdomen: Subcentimeter low-attenuation lesions in the liver
are too small to characterize. In the absence of known malignancy,
cysts or hemangiomas are likely. Visualized portions of the liver,
spleen and stomach are otherwise unremarkable.

Musculoskeletal: None.
IMPRESSION: 1. Tiny pulmonary nodules measure 3 mm or less in size. No follow-up
needed if patient is low-risk (and has no known or suspected primary
neoplasm). Non-contrast chest CT can be considered in 12 months if
patient is high-risk. This recommendation follows the consensus
statement: Guidelines for Management of Incidental Pulmonary Nodules
Detected on CT Images: From the [HOSPITAL] 9737; Radiology
9737; [DATE].
2. Ascending aortic aneurysm. Recommend annual imaging followup by
CTA or MRA. This recommendation follows 0888
ACCF/AHA/AATS/ACR/ASA/SCA/EVENO/SIELA/DEYSER/ANJA-RIITTA Guidelines for the
Diagnosis and Management of Patients with Thoracic Aortic Disease.
Circulation. 0888; 121: E266-e369. Aortic aneurysm NOS
(SVY5Q-WV0.P).
FINDINGS: Quality: Very good, HR 50

Coronary calcium score: The patient's coronary artery calcium score
is 3.97, which places the patient in the 20th percentile.

Coronary arteries: Normal coronary origins.  Right dominance.

Right Coronary Artery: Dominant.  No disease.

Left Main Coronary Artery: Normal. Bifurcates into the LAD and LCx
arteries.

Left Anterior Descending Coronary Artery: Anterior vessel with a
single moderate-sized proximal diagonal branch. Minimal mixed 1-24%
stenosis ([id number]).

Left Circumflex Artery: AV groove vessel without disease. 2 OM
branches without disease.

Aorta: Dilated sinus of valsalva to 40 mm. Dilated ascending aorta
to 42 mm at the mid ascending portion (level of the PA bifurcation)
measured double oblique. Aortic atherosclerosis. No dissection.

Aortic Valve: Trileaflet.  No calcifications.

Other findings:

Marked proximal assymetric septal hypertrophy

Normal pulmonary vein drainage into the left atrium.

Normal left atrial appendage without a thrombus.

Normal size of the pulmonary artery.
IMPRESSION: 1. Minimal mixed non-obstructive CAD, CADRADS = 1.

2. Coronary calcium score of 3.97. This was 20th percentile for age
and sex matched control.

3. Normal coronary origin with right dominance.

4. Dilated sinus of valsalva to 40 mm. Dilated ascending aorta to 42
mm at the mid ascending portion (level of the PA bifurcation)
measured double oblique. Aortic atherosclerosis. No dissection.

5. Marked proximal assymetric septal hypertrophy - echo correlation
is advised (septal thickening was noted on my review of prior echo
image in 0408)

*** End of Addendum ***
EXAM:
OVER-READ INTERPRETATION  CT CHEST

The following report is an over-read performed by radiologist Dr.
Sadayoshi Asson [REDACTED] on 01/19/2021. This
over-read does not include interpretation of cardiac or coronary
anatomy or pathology. The coronary calcium score/coronary CTA
interpretation by the cardiologist is attached.
FINDINGS: Vascular: Ascending aorta measures 4.2 cm. Heart is enlarged. Small
amount of pericardial fluid is likely physiologic.

Mediastinum/Nodes: None.

Lungs/Pleura: Tiny nodules in the right middle lobe measure up to 3
mm. No pleural fluid. Visualized airway is unremarkable.

Upper Abdomen: Subcentimeter low-attenuation lesions in the liver
are too small to characterize. In the absence of known malignancy,
cysts or hemangiomas are likely. Visualized portions of the liver,
spleen and stomach are otherwise unremarkable.

Musculoskeletal: None.
IMPRESSION: 1. Tiny pulmonary nodules measure 3 mm or less in size. No follow-up
needed if patient is low-risk (and has no known or suspected primary
neoplasm). Non-contrast chest CT can be considered in 12 months if
patient is high-risk. This recommendation follows the consensus
statement: Guidelines for Management of Incidental Pulmonary Nodules
Detected on CT Images: From the [HOSPITAL] 9737; Radiology
9737; [DATE].
2. Ascending aortic aneurysm. Recommend annual imaging followup by
CTA or MRA. This recommendation follows 0888
ACCF/AHA/AATS/ACR/ASA/SCA/EVENO/SIELA/DEYSER/ANJA-RIITTA Guidelines for the
Diagnosis and Management of Patients with Thoracic Aortic Disease.
Circulation. 0888; 121: [id number]. Aortic aneurysm NOS
([id number]-[id number].P).

## 2022-01-17 ENCOUNTER — Encounter: Payer: Self-pay | Admitting: Cardiology

## 2022-01-17 ENCOUNTER — Ambulatory Visit: Payer: Medicare Other | Admitting: Cardiology

## 2022-01-17 VITALS — BP 128/88 | HR 61 | Ht 70.0 in | Wt 226.2 lb

## 2022-01-17 DIAGNOSIS — I10 Essential (primary) hypertension: Secondary | ICD-10-CM | POA: Diagnosis not present

## 2022-01-17 DIAGNOSIS — I480 Paroxysmal atrial fibrillation: Secondary | ICD-10-CM | POA: Diagnosis not present

## 2022-01-17 DIAGNOSIS — I2510 Atherosclerotic heart disease of native coronary artery without angina pectoris: Secondary | ICD-10-CM

## 2022-01-17 DIAGNOSIS — I7789 Other specified disorders of arteries and arterioles: Secondary | ICD-10-CM

## 2022-01-17 DIAGNOSIS — G319 Degenerative disease of nervous system, unspecified: Secondary | ICD-10-CM | POA: Diagnosis not present

## 2022-01-17 DIAGNOSIS — R42 Dizziness and giddiness: Secondary | ICD-10-CM

## 2022-01-17 NOTE — Patient Instructions (Signed)
Medication Instructions:  Your physician recommends that you continue on your current medications as directed. Please refer to the Current Medication list given to you today.  *If you need a refill on your cardiac medications before your next appointment, please call your pharmacy*   Lab Work: None If you have labs (blood work) drawn today and your tests are completely normal, you will receive your results only by: [place] (if you have MyChart) OR A paper copy in the mail If you have any lab test that is abnormal or we need to change your treatment, we will call you to review the results.   Testing/Procedures: Non-Cardiac CT scanning, (CAT scanning), is a noninvasive, special x-ray that produces cross-sectional images of the body using x-rays and a computer. CT scans help physicians diagnose and treat medical conditions. For some CT exams, a contrast material is used to enhance visibility in the area of the body being studied. CT scans provide greater clarity and reveal more details than regular x-ray exams.    Follow-Up: At [facility], you and your health needs are our priority.  As part of our continuing mission to provide you with exceptional heart care, we have created designated Provider Care Teams.  These Care Teams include your primary Cardiologist (physician) and Advanced Practice Providers (APPs -  Physician Assistants and Nurse Practitioners) who all work together to provide you with the care you need, when you need it.  We recommend signing up for the patient portal called "MyChart".  Sign up information is provided on this After Visit Summary.  MyChart is used to connect with patients for Virtual Visits (Telemedicine).  Patients are able to view lab/test results, encounter notes, upcoming appointments, etc.  Non-urgent messages can be sent to your provider as well.   To learn more about what you can do with MyChart, go to [email or web address].    Your next  appointment:   6 month(s)  The format for your next appointment:   In Person  Provider:   Shirlee More, MD    Other Instructions None  Important Information About [place]

## 2022-01-17 NOTE — Progress Notes (Signed)
Cardiology Office Note:    Date:  01/17/2022   ID:  Eddie [name], DOB July 13, 1951, MRN [id number]  PCP:  Street, Sharon Mt, MD  Cardiologist:  Shirlee More, MD    Referring MD: [address], Sharon Mt, *    ASSESSMENT:    1. Vertigo   2. Paroxysmal atrial fibrillation (HCC)   3. Enlarged thoracic aorta ([place])   4. Essential hypertension   5. Mild CAD    PLAN:    In order of problems listed above:  His clinical problem today is vertigo severe in the context of hearing loss tinnitus and previous episodes.  His concern is that he has a dull headache and a brother present with stroke in a similar fashion we will do a stat CT scan and make referral to ENT continue his Antivert. I reviewed the monitor strips he is not having atrial fibrillation in particular would not anticoagulate him today Using current criteria CTA has mild enlargement 42 mm top normal 40 sinus of Valsalva and root ascending aorta and with the need to do other evaluation due for a follow-up CT today Has been controlled when he felt so sick his blood pressure is moderately elevated he will continue to trend at home and his current antihypertensive medications Stable mild nonobstructive CAD continue aspirin statin   Next appointment: 6 months   Medication Adjustments/Labs and Tests Ordered: Current medicines are reviewed at length with the patient today.  Concerns regarding medicines are outlined above.  No orders of the defined types were placed in this encounter.  No orders of the defined types were placed in this encounter.   Chief Complaint  Patient presents with   Follow-up   Atrial Fibrillation   Enlarged thoracic aorta    History of Present Illness:    Eddie [name] is a 71 y.o. male with a hx of paroxysmal atrial fibrillation hypertension mild enlargement of the aortic root last seen on 01/12/2021.  Compliance with diet, lifestyle and medications: Yes  He had cardiac CTA performed  01/19/2021.  His calcium score was quite low at 426 percentile, sinus of Valsalva was mildly dilated 40 mm ascending aorta 42 mm and minimal nonobstructive CAD  His EchoCardiogram performed 11/05/2019 reported and showing no left ventricular hypertrophy  He is worked into my office hours today at his request He has a background history of hearing loss tinnitus as having classic vertigo.  Symptoms are very severe he awoke the other morning with that he also has a headache. Vertigo is precipitated by standing supine posture and turning the head. Symptoms are somewhat alleviated in the last 48 hours with Antivert but have not are quite gone away He thought perhaps he had atrial fibrillation he used his mobile Kardia device 1 strip unreadable and I agree through so much artifact there is no active EKG signal 2 strips possible atrial fibrillation are clearly sinus rhythm He has a brother who had a stroke who presented in a very similar fashion and has a dull headache we will send him for CT scan today I think what he needs to do is be seen by ENT for full evaluation as well as treatment. 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   diphenhydramine-acetaminophen (TYLENOL PM) 25-500 MG TABS tablet Take 1 tablet by mouth at bedtime as needed (SLEEP).   lisinopril (ZESTRIL) 10 MG tablet Take by mouth daily.    MELOXICAM PO Take by mouth.   pravastatin (PRAVACHOL) 20 MG tablet Take 1 tablet (20 mg total) by mouth daily.   tamsulosin (FLOMAX) 0.4 MG CAPS capsule TAKE 1 CAPSULE BY MOUTH EVERY DAY      Allergies:   Latex   Social History   Socioeconomic History   Marital status: Married    Spouse name: Not on file   Number of children: Not on file   Years of education: Not on file   Highest education level: Not on file  Occupational History   Not on file  Tobacco Use   Smoking status: Former    Types: Cigarettes    Quit date: 09/24/1987    Years since quitting: 34.3    Passive exposure: Past   Smokeless tobacco: Former    Quit date: 09/24/1987  Vaping Use   Vaping Use: Never used  Substance and Sexual Activity   Alcohol use: Never   Drug use: Never   Sexual activity: Not on file  Other Topics Concern   Not on file  Social History Narrative   Not on file   Social Determinants of Health   Financial Resource Strain: Not on file  Food Insecurity: Not on file  Transportation Needs: Not on file  Physical Activity: Not on file  Stress: Not on file  Social Connections: Not on file     Family History: The patient's family history includes Alcoholism in his sister; Cancer in his sister; Heart Problems in his brother; Leukemia in his father; Stroke in his brother and mother. ROS:   Please see the history of present illness.    All other systems reviewed and are negative.  EKGs/Labs/Other Studies Reviewed:    The following studies were reviewed today:  I reviewed his office EKG with him today  Recent Labs: 01/30/2021 cholesterol 139 triglycerides 113 A1c 5.5 hemoglobin 14.9  Physical Exam:    VS:  BP 128/88 (BP Location: Right Arm, Patient Position: Sitting)   Pulse 61   Ht '5\' 10"'$  (1.778 m)   Wt 226 lb 3.2 oz (102.6 kg)   SpO2 98%   BMI 32.46 kg/m     Wt Readings from Last 3 Encounters:  01/17/22 226 lb 3.2 oz (102.6 kg)  01/12/21 219 lb 9.6 oz (99.6 kg)  12/14/19 220 lb (99.8 kg)     GEN:  Well nourished, well developed in no acute distress HEENT: Normal NECK: No JVD; No carotid bruits LYMPHATICS: No lymphadenopathy CARDIAC: RRR, no murmurs, rubs,  gallops RESPIRATORY:  Clear to auscultation without rales, wheezing or rhonchi  ABDOMEN: Soft, non-tender, non-distended MUSCULOSKELETAL:  No edema; No deformity  SKIN: Warm and dry NEUROLOGIC:  Alert and oriented x 3 PSYCHIATRIC:  Normal affect    Signed, Shirlee More, MD  01/17/2022 11:55 AM    [place]

## 2022-01-18 ENCOUNTER — Encounter (HOSPITAL_BASED_OUTPATIENT_CLINIC_OR_DEPARTMENT_OTHER): Payer: Self-pay

## 2022-01-18 ENCOUNTER — Ambulatory Visit (HOSPITAL_BASED_OUTPATIENT_CLINIC_OR_DEPARTMENT_OTHER): Payer: Medicare Other

## 2022-01-18 ENCOUNTER — Telehealth: Payer: Self-pay

## 2022-01-23 ENCOUNTER — Other Ambulatory Visit: Payer: Self-pay

## 2022-01-23 ENCOUNTER — Telehealth: Payer: Self-pay | Admitting: Cardiology

## 2022-01-23 DIAGNOSIS — R42 Dizziness and giddiness: Secondary | ICD-10-CM

## 2022-01-23 NOTE — Telephone Encounter (Signed)
   Pt said, he called Dr. Noreene Filbert office for [place]. He said, he was told Dr.Wolicki is retired and asking if Dr. Bettina Gavia has a different provider he can see. Also, that office first available if in September. He said, he can go any office and will see any doctor that Dr. Bettina Gavia prefers

## 2022-01-23 NOTE — Telephone Encounter (Signed)
Called patient and he reported that he called to set-up an appointment with Dr. Noreene Filbert office and he had retired. Patient is calling in today to get an ENT consult to another ENT doctor. Spoke with Dr. Agustin Cree about this patient's need for another ENT consult and he referred him to see Dr. Gaylyn Cheers in [place]. An ENT consult was placed for Dr. Gaylyn Cheers and the patient was made aware. He had no further questions at this time.

## 2022-01-27 DIAGNOSIS — I480 Paroxysmal atrial fibrillation: Secondary | ICD-10-CM | POA: Diagnosis not present

## 2022-01-27 DIAGNOSIS — R002 Palpitations: Secondary | ICD-10-CM | POA: Diagnosis not present

## 2022-01-27 DIAGNOSIS — D171 Benign lipomatous neoplasm of skin and subcutaneous tissue of trunk: Secondary | ICD-10-CM | POA: Diagnosis not present

## 2022-01-27 DIAGNOSIS — R42 Dizziness and giddiness: Secondary | ICD-10-CM | POA: Diagnosis not present

## 2022-01-27 DIAGNOSIS — I700 Atherosclerosis of aorta: Secondary | ICD-10-CM | POA: Diagnosis not present

## 2022-01-27 DIAGNOSIS — Z87891 Personal history of nicotine dependence: Secondary | ICD-10-CM | POA: Diagnosis not present

## 2022-01-27 DIAGNOSIS — H8110 Benign paroxysmal vertigo, unspecified ear: Secondary | ICD-10-CM | POA: Diagnosis not present

## 2022-01-27 DIAGNOSIS — I719 Aortic aneurysm of unspecified site, without rupture: Secondary | ICD-10-CM | POA: Diagnosis not present

## 2022-01-27 DIAGNOSIS — I10 Essential (primary) hypertension: Secondary | ICD-10-CM | POA: Diagnosis not present

## 2022-01-27 DIAGNOSIS — R0602 Shortness of breath: Secondary | ICD-10-CM | POA: Diagnosis not present

## 2022-01-27 DIAGNOSIS — I7121 Aneurysm of the ascending aorta, without rupture: Secondary | ICD-10-CM | POA: Diagnosis not present

## 2022-01-28 DIAGNOSIS — I4891 Unspecified atrial fibrillation: Secondary | ICD-10-CM | POA: Diagnosis not present

## 2022-01-30 ENCOUNTER — Telehealth: Payer: Self-pay | Admitting: Cardiology

## 2022-01-30 DIAGNOSIS — I712 Thoracic aortic aneurysm, without rupture, unspecified: Secondary | ICD-10-CM | POA: Diagnosis not present

## 2022-01-30 DIAGNOSIS — D6869 Other thrombophilia: Secondary | ICD-10-CM | POA: Diagnosis not present

## 2022-01-30 DIAGNOSIS — I480 Paroxysmal atrial fibrillation: Secondary | ICD-10-CM | POA: Diagnosis not present

## 2022-01-30 NOTE — Telephone Encounter (Signed)
Patient wanted to make Dr. Bettina Gavia aware that he was recently in the [facility] Emergency Room.  He wants to make sure Dr. Bettina Gavia has the records and his records with his PCP at [facility] today.

## 2022-02-04 NOTE — Telephone Encounter (Signed)
Called patient and reviewed CT results: Eddie [name], CMA  02/01/2022  4:58 PM EDT     LM to return my call.    Park Liter, MD  02/01/2022 10:29 AM EDT     CT of the head did not show any significant pathology    Patient also states that he was referred to EP and was calling back to make that appointment. Advised that I would route message to our EP scheduler who will [name] out to him. Patient verbalized understanding.

## 2022-02-04 NOTE — Telephone Encounter (Signed)
Patient was returning a call he got from the office at closing time on Friday. He said by the time he was able to return the call the office was already closed.  Please call back

## 2022-02-08 ENCOUNTER — Ambulatory Visit: Payer: PPO | Admitting: Cardiology

## 2022-02-09 ENCOUNTER — Other Ambulatory Visit: Payer: Self-pay | Admitting: Cardiology

## 2022-02-11 NOTE — Addendum Note (Signed)
Encounter addended by: Annie Paras on: 02/11/2022 2:40 PM  Actions taken: Letter saved

## 2022-02-11 NOTE — Telephone Encounter (Signed)
Rx refill sent to pharmacy. 

## 2022-02-13 DIAGNOSIS — I712 Thoracic aortic aneurysm, without rupture, unspecified: Secondary | ICD-10-CM | POA: Diagnosis not present

## 2022-02-13 DIAGNOSIS — I10 Essential (primary) hypertension: Secondary | ICD-10-CM | POA: Diagnosis not present

## 2022-02-13 DIAGNOSIS — N138 Other obstructive and reflux uropathy: Secondary | ICD-10-CM | POA: Diagnosis not present

## 2022-02-13 DIAGNOSIS — E782 Mixed hyperlipidemia: Secondary | ICD-10-CM | POA: Diagnosis not present

## 2022-02-13 DIAGNOSIS — D6869 Other thrombophilia: Secondary | ICD-10-CM | POA: Diagnosis not present

## 2022-02-13 DIAGNOSIS — Z0000 Encounter for general adult medical examination without abnormal findings: Secondary | ICD-10-CM | POA: Diagnosis not present

## 2022-02-13 DIAGNOSIS — I480 Paroxysmal atrial fibrillation: Secondary | ICD-10-CM | POA: Diagnosis not present

## 2022-02-13 DIAGNOSIS — Z79899 Other long term (current) drug therapy: Secondary | ICD-10-CM | POA: Diagnosis not present

## 2022-02-13 DIAGNOSIS — R7302 Impaired glucose tolerance (oral): Secondary | ICD-10-CM | POA: Diagnosis not present

## 2022-02-16 DIAGNOSIS — R079 Chest pain, unspecified: Secondary | ICD-10-CM | POA: Diagnosis not present

## 2022-02-16 DIAGNOSIS — Z79899 Other long term (current) drug therapy: Secondary | ICD-10-CM | POA: Diagnosis not present

## 2022-02-16 DIAGNOSIS — I272 Pulmonary hypertension, unspecified: Secondary | ICD-10-CM | POA: Diagnosis not present

## 2022-02-16 DIAGNOSIS — I7121 Aneurysm of the ascending aorta, without rupture: Secondary | ICD-10-CM | POA: Diagnosis not present

## 2022-02-16 DIAGNOSIS — Z7901 Long term (current) use of anticoagulants: Secondary | ICD-10-CM | POA: Diagnosis not present

## 2022-02-16 DIAGNOSIS — E785 Hyperlipidemia, unspecified: Secondary | ICD-10-CM | POA: Diagnosis not present

## 2022-02-16 DIAGNOSIS — Z87891 Personal history of nicotine dependence: Secondary | ICD-10-CM | POA: Diagnosis not present

## 2022-02-16 DIAGNOSIS — I10 Essential (primary) hypertension: Secondary | ICD-10-CM | POA: Diagnosis not present

## 2022-02-16 DIAGNOSIS — R531 Weakness: Secondary | ICD-10-CM | POA: Diagnosis not present

## 2022-02-16 DIAGNOSIS — I088 Other rheumatic multiple valve diseases: Secondary | ICD-10-CM | POA: Diagnosis not present

## 2022-02-16 DIAGNOSIS — R42 Dizziness and giddiness: Secondary | ICD-10-CM | POA: Diagnosis not present

## 2022-02-16 DIAGNOSIS — I480 Paroxysmal atrial fibrillation: Secondary | ICD-10-CM | POA: Diagnosis not present

## 2022-02-16 DIAGNOSIS — I4891 Unspecified atrial fibrillation: Secondary | ICD-10-CM | POA: Diagnosis not present

## 2022-02-17 DIAGNOSIS — I4891 Unspecified atrial fibrillation: Secondary | ICD-10-CM | POA: Diagnosis not present

## 2022-02-18 DIAGNOSIS — D171 Benign lipomatous neoplasm of skin and subcutaneous tissue of trunk: Secondary | ICD-10-CM | POA: Diagnosis not present

## 2022-02-18 DIAGNOSIS — I4891 Unspecified atrial fibrillation: Secondary | ICD-10-CM | POA: Diagnosis not present

## 2022-02-18 DIAGNOSIS — R079 Chest pain, unspecified: Secondary | ICD-10-CM | POA: Diagnosis not present

## 2022-02-18 DIAGNOSIS — I77810 Thoracic aortic ectasia: Secondary | ICD-10-CM | POA: Diagnosis not present

## 2022-02-19 DIAGNOSIS — I7121 Aneurysm of the ascending aorta, without rupture: Secondary | ICD-10-CM | POA: Diagnosis not present

## 2022-02-19 DIAGNOSIS — I4891 Unspecified atrial fibrillation: Secondary | ICD-10-CM | POA: Diagnosis not present

## 2022-02-19 DIAGNOSIS — R002 Palpitations: Secondary | ICD-10-CM | POA: Diagnosis not present

## 2022-02-25 ENCOUNTER — Institutional Professional Consult (permissible substitution): Payer: Medicare Other | Admitting: Cardiology

## 2022-02-25 DIAGNOSIS — R002 Palpitations: Secondary | ICD-10-CM | POA: Diagnosis not present

## 2022-02-25 DIAGNOSIS — I4891 Unspecified atrial fibrillation: Secondary | ICD-10-CM | POA: Diagnosis not present

## 2022-03-24 DIAGNOSIS — I459 Conduction disorder, unspecified: Secondary | ICD-10-CM | POA: Diagnosis not present

## 2022-03-24 DIAGNOSIS — I4891 Unspecified atrial fibrillation: Secondary | ICD-10-CM | POA: Diagnosis not present

## 2022-07-19 ENCOUNTER — Ambulatory Visit: Payer: Medicare Other | Admitting: Cardiology

## 2022-08-28 DIAGNOSIS — M791 Myalgia, unspecified site: Secondary | ICD-10-CM | POA: Diagnosis not present

## 2022-11-20 DIAGNOSIS — I4891 Unspecified atrial fibrillation: Secondary | ICD-10-CM | POA: Diagnosis not present

## 2022-11-20 DIAGNOSIS — I4519 Other right bundle-branch block: Secondary | ICD-10-CM | POA: Diagnosis not present

## 2022-11-26 DIAGNOSIS — N342 Other urethritis: Secondary | ICD-10-CM | POA: Diagnosis not present

## 2022-11-26 DIAGNOSIS — B356 Tinea cruris: Secondary | ICD-10-CM | POA: Diagnosis not present

## 2022-11-26 DIAGNOSIS — Z9181 History of falling: Secondary | ICD-10-CM | POA: Diagnosis not present

## 2022-11-26 DIAGNOSIS — N138 Other obstructive and reflux uropathy: Secondary | ICD-10-CM | POA: Diagnosis not present

## 2022-12-27 DIAGNOSIS — L245 Irritant contact dermatitis due to other chemical products: Secondary | ICD-10-CM | POA: Diagnosis not present

## 2023-01-17 DIAGNOSIS — N471 Phimosis: Secondary | ICD-10-CM | POA: Diagnosis not present

## 2023-01-17 DIAGNOSIS — D6869 Other thrombophilia: Secondary | ICD-10-CM | POA: Diagnosis not present

## 2023-01-17 DIAGNOSIS — L245 Irritant contact dermatitis due to other chemical products: Secondary | ICD-10-CM | POA: Diagnosis not present

## 2023-01-17 DIAGNOSIS — I712 Thoracic aortic aneurysm, without rupture, unspecified: Secondary | ICD-10-CM | POA: Diagnosis not present

## 2023-01-17 DIAGNOSIS — I480 Paroxysmal atrial fibrillation: Secondary | ICD-10-CM | POA: Diagnosis not present

## 2023-02-24 DIAGNOSIS — L900 Lichen sclerosus et atrophicus: Secondary | ICD-10-CM | POA: Diagnosis not present

## 2023-04-02 DIAGNOSIS — Z79899 Other long term (current) drug therapy: Secondary | ICD-10-CM | POA: Diagnosis not present

## 2023-04-02 DIAGNOSIS — N138 Other obstructive and reflux uropathy: Secondary | ICD-10-CM | POA: Diagnosis not present

## 2023-04-02 DIAGNOSIS — E782 Mixed hyperlipidemia: Secondary | ICD-10-CM | POA: Diagnosis not present

## 2023-04-02 DIAGNOSIS — I712 Thoracic aortic aneurysm, without rupture, unspecified: Secondary | ICD-10-CM | POA: Diagnosis not present

## 2023-04-02 DIAGNOSIS — I10 Essential (primary) hypertension: Secondary | ICD-10-CM | POA: Diagnosis not present

## 2023-04-02 DIAGNOSIS — I480 Paroxysmal atrial fibrillation: Secondary | ICD-10-CM | POA: Diagnosis not present

## 2023-04-02 DIAGNOSIS — R7301 Impaired fasting glucose: Secondary | ICD-10-CM | POA: Diagnosis not present

## 2023-04-02 DIAGNOSIS — D6869 Other thrombophilia: Secondary | ICD-10-CM | POA: Diagnosis not present

## 2023-04-23 DIAGNOSIS — I4891 Unspecified atrial fibrillation: Secondary | ICD-10-CM | POA: Diagnosis not present

## 2023-04-23 DIAGNOSIS — Z1211 Encounter for screening for malignant neoplasm of colon: Secondary | ICD-10-CM | POA: Diagnosis not present

## 2023-04-23 DIAGNOSIS — D125 Benign neoplasm of sigmoid colon: Secondary | ICD-10-CM | POA: Diagnosis not present

## 2023-04-23 DIAGNOSIS — D122 Benign neoplasm of ascending colon: Secondary | ICD-10-CM | POA: Diagnosis not present

## 2023-04-23 DIAGNOSIS — D123 Benign neoplasm of transverse colon: Secondary | ICD-10-CM | POA: Diagnosis not present

## 2023-06-25 DIAGNOSIS — I10 Essential (primary) hypertension: Secondary | ICD-10-CM | POA: Diagnosis not present

## 2023-06-25 DIAGNOSIS — I4891 Unspecified atrial fibrillation: Secondary | ICD-10-CM | POA: Diagnosis not present

## 2023-07-10 DIAGNOSIS — H1033 Unspecified acute conjunctivitis, bilateral: Secondary | ICD-10-CM | POA: Diagnosis not present

## 2023-07-14 DIAGNOSIS — R051 Acute cough: Secondary | ICD-10-CM | POA: Diagnosis not present

## 2023-07-14 DIAGNOSIS — R0981 Nasal congestion: Secondary | ICD-10-CM | POA: Diagnosis not present

## 2023-08-13 DIAGNOSIS — M25561 Pain in right knee: Secondary | ICD-10-CM | POA: Diagnosis not present

## 2023-10-17 DIAGNOSIS — D6869 Other thrombophilia: Secondary | ICD-10-CM | POA: Diagnosis not present

## 2023-10-17 DIAGNOSIS — N138 Other obstructive and reflux uropathy: Secondary | ICD-10-CM | POA: Diagnosis not present

## 2023-10-17 DIAGNOSIS — I712 Thoracic aortic aneurysm, without rupture, unspecified: Secondary | ICD-10-CM | POA: Diagnosis not present

## 2023-10-17 DIAGNOSIS — J0140 Acute pansinusitis, unspecified: Secondary | ICD-10-CM | POA: Diagnosis not present

## 2023-10-17 DIAGNOSIS — I10 Essential (primary) hypertension: Secondary | ICD-10-CM | POA: Diagnosis not present

## 2023-10-17 DIAGNOSIS — R300 Dysuria: Secondary | ICD-10-CM | POA: Diagnosis not present

## 2023-10-17 DIAGNOSIS — I480 Paroxysmal atrial fibrillation: Secondary | ICD-10-CM | POA: Diagnosis not present

## 2023-12-08 DIAGNOSIS — R338 Other retention of urine: Secondary | ICD-10-CM | POA: Diagnosis not present

## 2023-12-08 DIAGNOSIS — R39198 Other difficulties with micturition: Secondary | ICD-10-CM | POA: Diagnosis not present

## 2023-12-08 DIAGNOSIS — R300 Dysuria: Secondary | ICD-10-CM | POA: Diagnosis not present

## 2023-12-08 DIAGNOSIS — R3912 Poor urinary stream: Secondary | ICD-10-CM | POA: Diagnosis not present

## 2023-12-08 DIAGNOSIS — R8290 Unspecified abnormal findings in urine: Secondary | ICD-10-CM | POA: Diagnosis not present

## 2023-12-08 DIAGNOSIS — R350 Frequency of micturition: Secondary | ICD-10-CM | POA: Diagnosis not present

## 2023-12-08 DIAGNOSIS — R3915 Urgency of urination: Secondary | ICD-10-CM | POA: Diagnosis not present

## 2024-01-05 DIAGNOSIS — R338 Other retention of urine: Secondary | ICD-10-CM | POA: Diagnosis not present

## 2024-01-05 DIAGNOSIS — R3915 Urgency of urination: Secondary | ICD-10-CM | POA: Diagnosis not present

## 2024-01-05 DIAGNOSIS — R350 Frequency of micturition: Secondary | ICD-10-CM | POA: Diagnosis not present

## 2024-01-05 DIAGNOSIS — R3912 Poor urinary stream: Secondary | ICD-10-CM | POA: Diagnosis not present

## 2024-01-05 DIAGNOSIS — R300 Dysuria: Secondary | ICD-10-CM | POA: Diagnosis not present

## 2024-02-18 DIAGNOSIS — L720 Epidermal cyst: Secondary | ICD-10-CM | POA: Diagnosis not present

## 2024-02-18 DIAGNOSIS — L02212 Cutaneous abscess of back [any part, except buttock]: Secondary | ICD-10-CM | POA: Diagnosis not present

## 2024-03-17 DIAGNOSIS — M1711 Unilateral primary osteoarthritis, right knee: Secondary | ICD-10-CM | POA: Diagnosis not present

## 2024-03-17 DIAGNOSIS — H8112 Benign paroxysmal vertigo, left ear: Secondary | ICD-10-CM | POA: Diagnosis not present

## 2024-03-24 DIAGNOSIS — I4891 Unspecified atrial fibrillation: Secondary | ICD-10-CM | POA: Diagnosis not present

## 2024-03-24 DIAGNOSIS — I10 Essential (primary) hypertension: Secondary | ICD-10-CM | POA: Diagnosis not present

## 2024-05-11 DIAGNOSIS — L570 Actinic keratosis: Secondary | ICD-10-CM | POA: Diagnosis not present

## 2024-05-11 DIAGNOSIS — L821 Other seborrheic keratosis: Secondary | ICD-10-CM | POA: Diagnosis not present

## 2024-05-11 DIAGNOSIS — B079 Viral wart, unspecified: Secondary | ICD-10-CM | POA: Diagnosis not present

## 2024-05-11 DIAGNOSIS — L578 Other skin changes due to chronic exposure to nonionizing radiation: Secondary | ICD-10-CM | POA: Diagnosis not present

## 2024-05-11 DIAGNOSIS — D044 Carcinoma in situ of skin of scalp and neck: Secondary | ICD-10-CM | POA: Diagnosis not present

## 2024-05-11 DIAGNOSIS — C4441 Basal cell carcinoma of skin of scalp and neck: Secondary | ICD-10-CM | POA: Diagnosis not present
